# Patient Record
Sex: Male | Born: 1955 | Race: White | Hispanic: No | Marital: Married | Smoking: Former smoker
Health system: Southern US, Community
[De-identification: ages and names within clinical notes are randomized; demographics above are authoritative.]

## PROBLEM LIST (undated history)

## (undated) DIAGNOSIS — C4490 Unspecified malignant neoplasm of skin, unspecified: Secondary | ICD-10-CM

## (undated) DIAGNOSIS — K429 Umbilical hernia without obstruction or gangrene: Secondary | ICD-10-CM

## (undated) DIAGNOSIS — K5720 Diverticulitis of large intestine with perforation and abscess without bleeding: Secondary | ICD-10-CM

## (undated) DIAGNOSIS — K635 Polyp of colon: Secondary | ICD-10-CM

## (undated) DIAGNOSIS — G4730 Sleep apnea, unspecified: Secondary | ICD-10-CM

## (undated) DIAGNOSIS — M1990 Unspecified osteoarthritis, unspecified site: Secondary | ICD-10-CM

## (undated) DIAGNOSIS — H269 Unspecified cataract: Secondary | ICD-10-CM

## (undated) DIAGNOSIS — K5792 Diverticulitis of intestine, part unspecified, without perforation or abscess without bleeding: Secondary | ICD-10-CM

## (undated) HISTORY — DX: Sleep apnea, unspecified: G47.30

## (undated) HISTORY — DX: Diverticulitis of intestine, part unspecified, without perforation or abscess without bleeding: K57.92

## (undated) HISTORY — DX: Polyp of colon: K63.5

## (undated) HISTORY — DX: Diverticulitis of large intestine with perforation and abscess without bleeding: K57.20

## (undated) HISTORY — DX: Umbilical hernia without obstruction or gangrene: K42.9

## (undated) HISTORY — DX: Unspecified cataract: H26.9

## (undated) HISTORY — DX: Unspecified osteoarthritis, unspecified site: M19.90

## (undated) HISTORY — DX: Unspecified malignant neoplasm of skin, unspecified: C44.90

## (undated) HISTORY — PX: COLONOSCOPY W/ POLYPECTOMY: SHX1380

---
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
## 1989-04-06 HISTORY — PX: OTHER SURGICAL HISTORY: SHX169

## 1994-04-06 HISTORY — PX: LAPAROSCOPIC CHOLECYSTECTOMY: SUR755

## 2009-11-25 ENCOUNTER — Ambulatory Visit: Payer: Self-pay | Admitting: Family Medicine

## 2009-11-25 DIAGNOSIS — R05 Cough: Secondary | ICD-10-CM

## 2009-11-25 DIAGNOSIS — K8020 Calculus of gallbladder without cholecystitis without obstruction: Secondary | ICD-10-CM | POA: Insufficient documentation

## 2009-11-25 DIAGNOSIS — Z8719 Personal history of other diseases of the digestive system: Secondary | ICD-10-CM

## 2009-11-25 DIAGNOSIS — Z85828 Personal history of other malignant neoplasm of skin: Secondary | ICD-10-CM

## 2009-11-25 LAB — CONVERTED CEMR LAB
Blood in Urine, dipstick: NEGATIVE
Nitrite: NEGATIVE
Protein, U semiquant: NEGATIVE
Urobilinogen, UA: 0.2
WBC Urine, dipstick: NEGATIVE

## 2009-11-26 LAB — CONVERTED CEMR LAB
ALT: 24 units/L (ref 0–53)
BUN: 18 mg/dL (ref 6–23)
CO2: 28 meq/L (ref 19–32)
Chloride: 104 meq/L (ref 96–112)
Creatinine, Ser: 1 mg/dL (ref 0.4–1.5)
Eosinophils Absolute: 0.2 10*3/uL (ref 0.0–0.7)
Eosinophils Relative: 2.7 % (ref 0.0–5.0)
Glucose, Bld: 82 mg/dL (ref 70–99)
HCT: 42.5 % (ref 39.0–52.0)
Lymphs Abs: 1.6 10*3/uL (ref 0.7–4.0)
MCHC: 34.3 g/dL (ref 30.0–36.0)
MCV: 93.2 fL (ref 78.0–100.0)
Monocytes Absolute: 0.8 10*3/uL (ref 0.1–1.0)
PSA: 0.52 ng/mL (ref 0.10–4.00)
Platelets: 185 10*3/uL (ref 150.0–400.0)
Potassium: 4.7 meq/L (ref 3.5–5.1)
RDW: 13.3 % (ref 11.5–14.6)
TSH: 1.26 microintl units/mL (ref 0.35–5.50)
Total Bilirubin: 0.6 mg/dL (ref 0.3–1.2)
WBC: 7.5 10*3/uL (ref 4.5–10.5)

## 2009-12-13 ENCOUNTER — Telehealth: Payer: Self-pay | Admitting: Family Medicine

## 2009-12-13 ENCOUNTER — Ambulatory Visit: Payer: Self-pay | Admitting: Family Medicine

## 2009-12-17 ENCOUNTER — Ambulatory Visit: Payer: Self-pay | Admitting: Cardiovascular Disease

## 2010-01-08 ENCOUNTER — Telehealth: Payer: Self-pay | Admitting: Family Medicine

## 2010-01-13 ENCOUNTER — Telehealth: Payer: Self-pay | Admitting: Family Medicine

## 2010-05-06 NOTE — Assessment & Plan Note (Signed)
Summary: NEW TO EST--REQUESTING CPX--WILL FAST//CCM   Vital Signs:  Patient profile:   55 year old male Height:      72.5 inches Weight:      207 pounds BMI:     27.79 Temp:     98.1 degrees F oral BP sitting:   118 / 80  (left arm) Cuff size:   regular  Vitals Entered By: Fernando Mcfarland CMA Fernando Mcfarland) (November 25, 2009 8:36 AM) CC: new to establish, sinus headache Is Patient Diabetic? No Pain Assessment Patient in pain? no        CC:  new to establish and sinus headache.  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  He's always been health he said no chronic health problems except he had a tonsillectomy as a child.  Gallbladder removed in 1990 had 18 inches of his colon removed because of a diverticular abscess.  Prior to that.  He also had a history of colon polyps.  He son every 5-year recall.  Last colonoscopy was 4 years ago.  It was normal.  He's had two basal cells removed.  He grew up in [place] spell time outdoors fishing and hunting.  He's also had a, months, history of a cough.  It started when he moved here to [place].  He purchased a house.  It was two years old.  However the, houses never been, lived in.  There was some mold underneath the house.  Tetanus booster unknown, who get his old records.  Preventive Screening-Counseling & Management  Alcohol-Tobacco     Smoking Status: quit  Hep-HIV-STD-Contraception     Dental Visit-last 6 months yes      Drug Use:  no.    Allergies (verified): No Known Drug Allergies  Past History:  Past medical, surgical, family and social histories (including risk factors) reviewed, and no changes noted (except as noted below).  Past Medical History: Cholelithiasis Diverticulitis, hx of Skin  cancer, hx of..........BCE x 2  Past Surgical History: Cholecystectomy Tonsillectomy colon polyp   Family History: Reviewed history and no changes required. Father: alzhieimers Mother: hearth disease Siblings: sister - healthy  Social History: Reviewed history and no changes required. Occupation: Ecologist Married Alcohol use-yes Drug use-no Smoking Status:  quit Drug Use:  no Dental Care w/in 6 mos.:  yes  Review of Systems      See HPI  Physical Exam  General:  Well-developed,well-nourished,in no acute distress; alert,appropriate and cooperative throughout examination Head:  Normocephalic and atraumatic without obvious abnormalities. No apparent alopecia or balding. Eyes:  No corneal or conjunctival inflammation noted. EOMI. Perrla. Funduscopic exam benign, without hemorrhages, exudates or papilledema. Vision grossly normal. Ears:  External ear exam shows no significant lesions or deformities.  Otoscopic examination reveals clear canals, tympanic membranes are intact bilaterally without bulging, retraction, inflammation or discharge. Hearing is grossly normal bilaterally. Nose:  External nasal examination shows no deformity or inflammation. Nasal mucosa are pink and moist without lesions or exudates. Mouth:  Oral mucosa and oropharynx without lesions or exudates.  Teeth in good repair. Neck:  No deformities, masses, or tenderness noted. Chest Wall:  No deformities, masses, tenderness or gynecomastia noted. Breasts:  No masses or gynecomastia noted Lungs:  Normal respiratory effort, chest expands symmetrically. Lungs are clear  to auscultation, no crackles or wheezes. Heart:  Normal rate and regular rhythm. S1 and S2 normal without gallop, murmur, click, rub or other extra sounds. Abdomen:  Bowel sounds positive,abdomen soft and non-tender without masses, organomegaly or hernias noted...........scar from midline to the pubis from previous exploratory surgery Rectal:  No  external abnormalities noted. Normal sphincter tone. No rectal masses or tenderness. Genitalia:  Testes bilaterally descended without nodularity, tenderness or masses. No scrotal masses or lesions. No penis lesions or urethral discharge. Prostate:  Prostate gland firm and smooth, no enlargement, nodularity, tenderness, mass, asymmetry or induration. Msk:  No deformity or scoliosis noted of thoracic or lumbar spine.   Pulses:  R and L carotid,radial,femoral,dorsalis pedis and posterior tibial pulses are full and equal bilaterally Extremities:  No clubbing, cyanosis, edema, or deformity noted with normal full range of motion of all joints.   Neurologic:  No cranial nerve deficits noted. Station and gait are normal. Plantar reflexes are down-going bilaterally. DTRs are symmetrical throughout. Sensory, motor and coordinative functions appear intact. Skin:  Intact without suspicious lesions or rashes Cervical Nodes:  No lymphadenopathy noted Axillary Nodes:  No palpable lymphadenopathy Inguinal Nodes:  No significant adenopathy Psych:  Cognition and judgment appear intact. Alert and cooperative with normal attention span and concentration. No apparent delusions, illusions, hallucinations   Impression & Recommendations:  Problem # 1:  Preventive Health Care (ICD-V70.0) Assessment Comment Only  Problem # 2:  COUGH (ICD-786.2) Assessment: New  Orders: Venipuncture (27253) TLB-Lipid Panel (80061-LIPID) TLB-BMP (Basic Metabolic Panel-BMET) (80048-METABOL) TLB-CBC Platelet - w/Differential (85025-CBCD) TLB-Hepatic/Liver Function Pnl (80076-HEPATIC) TLB-TSH (Thyroid Stimulating Hormone) (84443-TSH) TLB-PSA (Prostate Specific Antigen) (84153-PSA) UA Dipstick w/o Micro (automated)  (81003)  Complete Medication List: 1)  Naproxen 375 Mg Tabs (Naproxen) .... Take one tab by mouth two times a day 2)  Prednisone 20 Mg Tabs (Prednisone) .... Uad  Other Orders: Specimen Handling (66440) EKG w/  Interpretation (93000) Tdap => 14yrs IM (34742) Admin 1st Vaccine (59563)  Patient Instructions: 1)  you could take 10 mg of Zyrtec nightly at bedtime to see if that would relieve the postnasal drip and cough.  If after a week to 10 days.  This is not work then I would recommend a short course of prednisone.  Two tabs x 3 days, one x 3 days, a half x 3 days, then a half a tablet Monday, Wednesday, Friday, for a two week taper.  Return p.r.n. 2)  Please schedule a follow-up appointment in 1 year. 3)  Take an Aspirin every day. 4)  please call us when you get the date of your last tetanus booster. 5)  I would recommend Dr. Gweneth Dimitri, ophthalmologist. 6)  If your wife needs anything have her  call Rachael directly Prescriptions: PREDNISONE 20 MG TABS (PREDNISONE) UAD  #30 x 1   Entered and Authorized by:   Roderick Pee MD   Signed by:   Roderick Pee MD on 11/25/2009   Method used:   Print then Give to Patient   RxID:   [id number]    Immunizations Administered:  Tetanus Vaccine:    Vaccine Type: Tdap    Site: right deltoid    Mfr: GlaxoSmithKline    Dose: 0.5 ml    Route: IM    Given by: Fernando Mcfarland CMA (AAMA)    Exp. Date: 01/24/2012    Lot #: [id number]    VIS given: 02/22/07 version given November 25, 2009.    Physician counseled: yes  Laboratory  Results   Urine Tests    Routine Urinalysis   Color: yellow Appearance: Clear Glucose: negative   (Normal Range: Negative) Bilirubin: negative   (Normal Range: Negative) Ketone: negative   (Normal Range: Negative) Spec. Gravity: 1.010   (Normal Range: 1.003-1.035) Blood: negative   (Normal Range: Negative) pH: 5.0   (Normal Range: 5.0-8.0) Protein: negative   (Normal Range: Negative) Urobilinogen: 0.2   (Normal Range: 0-1) Nitrite: negative   (Normal Range: Negative) Leukocyte Esterace: negative   (Normal Range: Negative)    Comments: Rita Ohara  November 25, 2009 10:30 AM

## 2010-05-06 NOTE — Progress Notes (Signed)
Summary: wants antibiotic  Phone Note Call from Patient Call back at [phone]   Summary of Call: Still coughing.  Wants antibiotic.  No fever.  Cough non productive.  Sinus drainage & drip.  On the prednisolone taper.  Cough still there.  [facility]  [facility]. Initial call taken by: Rudy Jew, RN,  December 13, 2009 8:50 AM  Follow-up for Phone Call        OV today for evaluation Follow-up by: Roderick Pee MD,  December 13, 2009 8:55 AM  Additional Follow-up for Phone Call Additional follow up Details #1::        Phone Call Completed Additional Follow-up by: Kern Reap CMA Duncan Dull),  December 13, 2009 9:43 AM

## 2010-05-06 NOTE — Assessment & Plan Note (Signed)
Summary: cough - rv   Vital Signs:  Patient profile:   55 year old male Weight:      210 pounds Temp:     97.9 degrees F oral BP sitting:   120 / 72  (right arm) Cuff size:   regular  Vitals Entered By: Kathrynn Speed CMA (December 13, 2009 3:33 PM) CC: cough x 7 weeks, src Is Patient Diabetic? No   CC:  cough x 7 weeks and src.  History of Present Illness: Fernando Mcfarland is a 55 year old, married male, nonsmoker, who comes in today for evaluation of a cough x 7 weeks.  His dentist in his first note of August, the 22nd.  He moved here from [place], [place].  The house had some mold, and he and his wife began coughing.  Her cough went away.  His cough has persisted.  We saw him on the 22nd and at that time.  He was wheezing.  We put him on a short course of prednisone, but it didn't help much.  He feels like he is only 25% improved.  He has no fever or sputum production et Karie Soda.  He does have a lot of postnasal drip and is concerned he may have sinusitis.  Preventive Screening-Counseling & Management  Alcohol-Tobacco     Smoking Status: quit  Current Medications (verified): 1)  Naproxen 375 Mg Tabs (Naproxen) .... Take One Tab By Mouth Two Times A Day 2)  Prednisone 20 Mg Tabs (Prednisone) .... Uad  Allergies (verified): No Known Drug Allergies  Physical Exam  General:  Well-developed,well-nourished,in no acute distress; alert,appropriate and cooperative throughout examination Head:  Normocephalic and atraumatic without obvious abnormalities. No apparent alopecia or balding. Eyes:  No corneal or conjunctival inflammation noted. EOMI. Perrla. Funduscopic exam benign, without hemorrhages, exudates or papilledema. Vision grossly normal. Ears:  External ear exam shows no significant lesions or deformities.  Otoscopic examination reveals clear canals, tympanic membranes are intact bilaterally without bulging, retraction, inflammation or discharge. Hearing is grossly normal  bilaterally. Nose:  External nasal examination shows no deformity or inflammation. Nasal mucosa are pink and moist without lesions or exudates. Mouth:  Oral mucosa and oropharynx without lesions or exudates.  Teeth in good repair. Neck:  No deformities, masses, or tenderness noted. Chest Wall:  No deformities, masses, tenderness or gynecomastia noted. Lungs:  Normal respiratory effort, chest expands symmetrically. Lungs are clear to auscultation, no crackles or wheezes.   Impression & Recommendations:  Problem # 1:  COUGH (ICD-786.2) Assessment Unchanged  Orders: T-2 View CXR (71020TC) T-Sinuses Complete (70220TC)  Complete Medication List: 1)  Naproxen 375 Mg Tabs (Naproxen) .... Take one tab by mouth two times a day 2)  Prednisone 20 Mg Tabs (Prednisone) .... Uad  Other Orders: Radiology Referral (Radiology)  Patient Instructions: 1)  go to the main office now for x-rays of your sinuses.  I will call you  when I  get the report 2)  the sinus films and x-ray reports were normal except for an abnormality on the sinus x-rays.  The radiologist is requesting a CT scan. 3)  Most likely symptoms consistent with reactive airway disease.  Prednisone 60 mg for 3 days, 40 for 3 days, 20 for 3 days, then 10 mg Monday, Wednesday, Friday, for two week taper it in 9 days.  He does not feel any improvement.  He is to call and we will get him set up for a pulmonary consult. 4)  Also will get him set up for  a CT scan per radiologist.  Recommendations

## 2010-05-06 NOTE — Progress Notes (Signed)
Summary: antibiotic  Phone Note Call from Patient   Caller: Patient Call For: Roderick Pee MD Summary of Call: Pt is asking for an antibiotic before he makes a referral to a specialist??  [phone] Initial call taken by: [facility] CMA,  January 13, 2010 4:53 PM  Follow-up for Phone Call        expl. no evid.of bact.inf.......cough x 2 mo. ,,,,,,,,,the patient does not want to go see a specialist for further diagnostic studies to evaluate the cause of the cough.  He wants to take an OTC Claritin because now he says he feeling somewhat better. Follow-up by: Roderick Pee MD,  January 13, 2010 5:21 PM

## 2010-05-06 NOTE — Progress Notes (Signed)
Summary: cough continues  Phone Note Call from Patient Call back at Work Phone [phone]   Summary of Call: Still coughing after 2 rounds of prednisone.  What to do next?  [facility]  [facility].  Initial call taken by: Rudy Jew, RN,  January 08, 2010 1:15 PM  Follow-up for Phone Call        we will arrange for a pulmonary consult ASAP Follow-up by: Roderick Pee MD,  January 09, 2010 8:42 AM  Additional Follow-up for Phone Call Additional follow up Details #1::        Appt Scheduled.  LMOM for pt. Additional Follow-up by: Corky Mull,  January 10, 2010 8:02 AM

## 2010-05-29 ENCOUNTER — Telehealth: Payer: Self-pay | Admitting: *Deleted

## 2010-05-29 DIAGNOSIS — R05 Cough: Secondary | ICD-10-CM

## 2010-05-29 NOTE — Telephone Encounter (Signed)
patient  Is requesting a referral for pulmonology

## 2010-06-12 ENCOUNTER — Institutional Professional Consult (permissible substitution): Payer: Self-pay | Admitting: Emergency Medicine

## 2010-06-23 ENCOUNTER — Encounter: Payer: Self-pay | Admitting: Emergency Medicine

## 2010-06-23 ENCOUNTER — Institutional Professional Consult (permissible substitution) (INDEPENDENT_AMBULATORY_CARE_PROVIDER_SITE_OTHER): Payer: 59 | Admitting: Emergency Medicine

## 2010-06-23 DIAGNOSIS — R05 Cough: Secondary | ICD-10-CM

## 2010-06-23 LAB — CONVERTED CEMR LAB
Cholesterol, target level: 200 mg/dL
HDL goal, serum: 40 mg/dL

## 2010-07-03 NOTE — Assessment & Plan Note (Signed)
Summary: cough   Visit Type:  Initial Consult Copy to:  Dr. Kelle Darting  CC:  Pulmonary consult for chronic dry cough...PND...tickle in his throat and Lipid Management.  History of Present Illness: 55 yo former smoker, OSA. Referred for cough. No hx childhood asthma.   He caught a URI beginning Spring '11, has had dry cough ever since, sometimes prod of clear. Has been rx 2x with pred, may have helped some but not much. Was on claritin for a couple weeks without relief. Remains active, no breathing limitations. Has had CT scans in [place] as part of lung CA screening trial.   Lipid Management History:      Positive NCEP/ATP III risk factors include male age 57 years old or older.  Negative NCEP/ATP III risk factors include non-tobacco-user status.     Preventive Screening-Counseling & Management  Alcohol-Tobacco     Alcohol drinks/day: 3     Alcohol type: beer     Smoking Status: quit     Packs/Day: 2.0     Year Started: 1977     Year Quit: 1992     Pack years: 30  Current Medications (verified): 1)  Naproxen 375 Mg Tabs (Naproxen) .... Take One Tab By Mouth Two Times A Day 2)  Claritin 10 Mg Tabs (Loratadine) .Marland Kitchen.. 1 By Mouth Daily As Needed  Allergies (verified): No Known Drug Allergies  Past History:  Past Medical History: Last updated: 11/25/2009 Cholelithiasis Diverticulitis, hx of Skin cancer, hx of..........BCE x 2  Family History: Last updated: 06/23/2010 Father: alzhieimers Mother: hearth disease Siblings: sister - healthy Family History Emphysema  --- PGF Family History Breast Cancer --- mother [place] --- mother  Past Surgical History: Reviewed history from 11/25/2009 and no changes required. Cholecystectomy Tonsillectomy colon polyp   Family History: Father: alzhieimers Mother: hearth disease Siblings: sister - healthy Family History Emphysema  --- PGF Family History Breast Cancer --- mother Bundle Branch --- mother  Social  History: Reviewed history from 11/25/2009 and no changes required. Occupation: Ecologist Married Alcohol use-yes Drug use-no Patient states former smoker.  Alcohol drinks/day:  3 Packs/Day:  2.0 Pack years:  30  Vital Signs:  Patient profile:   55 year old male Height:      71 inches (180.34 cm) Weight:      208.25 pounds (94.66 kg) BMI:     29.15 O2 Sat:      95 % on Room air Temp:     98.0 degrees F (36.67 degrees C) oral Pulse rate:   90 / minute BP sitting:   138 / 82  (left arm) Cuff size:   regular  Vitals Entered By: Michel Bickers CMA (June 23, 2010 3:50 PM)  O2 Sat at Rest %:  95 O2 Flow:  Room air CC: Pulmonary consult for chronic dry cough...PND...tickle in his throat, Lipid Management Is Patient Diabetic? No Comments Medications reviewed with patient Michel Bickers CMA  June 23, 2010 3:51 PM   Physical Exam  General:  well developed, well nourished, in no acute distress Head:  normocephalic and atraumatic Eyes:  conjunctiva and sclera clear Nose:  no deformity, discharge, inflammation, or lesions Mouth:  no deformity or lesions Neck:  no masses, thyromegaly, or abnormal cervical nodes Chest Wall:  no deformities noted Lungs:  clear bilaterally to auscultation and percussion Heart:  regular rate and rhythm, S1, S2 without murmurs, rubs, gallops, or clicks Msk:  no deformity or scoliosis noted with normal posture Extremities:  no clubbing,  cyanosis, edema, or deformity noted Skin:  intact without lesions or rashes Psych:  alert and cooperative; normal mood and affect; normal attention span and concentration   Medications Added to Medication List This Visit: 1)  Claritin 10 Mg Tabs (Loratadine) .Marland Kitchen.. 1 by mouth daily as needed  Other Orders: Consultation Level IV (16109)  Lipid Assessment/Plan:      Based on NCEP/ATP III, the patient's risk factor category is "0-1 risk factors".  The patient's lipid goals are as follows: Total cholesterol goal is 200;  LDL cholesterol goal is 160; HDL cholesterol goal is 40; Triglyceride goal is 150.    Patient Instructions: 1)  Start Claritin (loratadine) 10mg  once daily  2)  Start nasal saline washes every day 3)  IF you are still coughing after 3 weeks, then start omeprazole 20mg  (Prilosec) once daily  4)  Try to avoid throat clearing and mentholated cough drops. Use a sugar-free candy instead 5)  Follow with Dr Delton Coombes in 1 month

## 2010-07-09 ENCOUNTER — Telehealth: Payer: Self-pay | Admitting: Emergency Medicine

## 2010-07-09 NOTE — Telephone Encounter (Signed)
Pt was calling to verify from last OV was he to start taking the omeprazole if he was still having cough. ia dvised pt per instructions it states he was to start taking the omeprazole. Pt verbalized understanding and needed nothing else further

## 2010-07-28 ENCOUNTER — Encounter: Payer: Self-pay | Admitting: Emergency Medicine

## 2010-07-30 ENCOUNTER — Ambulatory Visit (INDEPENDENT_AMBULATORY_CARE_PROVIDER_SITE_OTHER): Payer: 59 | Admitting: Emergency Medicine

## 2010-07-30 VITALS — BP 124/84 | HR 74 | Temp 97.8°F | Ht 72.0 in | Wt 211.8 lb

## 2010-07-30 DIAGNOSIS — R05 Cough: Secondary | ICD-10-CM

## 2010-07-30 NOTE — Patient Instructions (Signed)
We will perform full pulmonary function testing at the time of your next appointment Follow up with Dr Delton Coombes next available appointment.  We will arrange bronchoscopy as an outpatient.

## 2010-07-30 NOTE — Progress Notes (Signed)
  Subjective:    Patient ID: Fernando Mcfarland, male    DOB: 11/29/1955, 55 y.o.   MRN: [id number]  HPI 55 yo former smoker, OSA. Referred for cough. No hx childhood asthma.   He caught a URI beginning Spring '11, has had dry cough ever since, sometimes prod of clear. Has been rx 2x with pred, may have helped some but not much. Was on claritin for a couple weeks without relief. Remains active, no breathing limitations. Has had CT scans in [place] as part of lung CA screening trial, stable nodules x 3 yrs.   ROV 07/30/10 -- f/u for cough. Last time we started loratadine and [place], then omeprazole - overlapped these for 10 - 14 days. Cough is a bit less but still present. Feels globus sensation. Able to exercise without cough, denies any allergic or GERD sx.    Review of Systems As per HPI    Objective:   Physical Exam Gen: Pleasant, well-nourished, in no distress,  normal affect  ENT: No lesions,  mouth clear,  oropharynx clear, no postnasal drip  Neck: No JVD, no TMG, no carotid bruits  Lungs: No use of accessory muscles, no dullness to percussion, clear without rales or rhonchi  Cardiovascular: RRR, heart sounds normal, no murmur or gallops, no peripheral edema  Musculoskeletal: No deformities, no cyanosis or clubbing  Neuro: alert, non focal  Skin: Warm, no lesions or rashes        Assessment & Plan:  COUGH Have empirically rx for allergic rhinitis and GERD, continues to have sx.  In a smoker we need to consider AFL, possible UA or endobronchial lesion.  - will arrange for PFT and FOB to perform airway exam - f/u next available

## 2010-07-30 NOTE — Assessment & Plan Note (Signed)
Have empirically rx for allergic rhinitis and GERD, continues to have sx.  In a smoker we need to consider AFL, possible UA or endobronchial lesion.  - will arrange for PFT and FOB to perform airway exam - f/u next available

## 2010-07-31 ENCOUNTER — Telehealth: Payer: Self-pay | Admitting: *Deleted

## 2010-07-31 DIAGNOSIS — G4730 Sleep apnea, unspecified: Secondary | ICD-10-CM

## 2010-07-31 NOTE — Telephone Encounter (Signed)
Bronchoscopy scheduled for Mon., 08/04/2010 @ [facility]. Pt needs to arrive @ 11:15 am in admitting, procedure will be done at 12:30 pm. NPO after midnight and someone needs to be him to drive. LMOMTCB.

## 2010-07-31 NOTE — Telephone Encounter (Addendum)
Pt is calling abck because he forgot to mention that he is on CPAP through [employer] out of [place], [place] and his machine is approx 56 years old. He says he probably doesn't clean the machine as often as he should and may need a new one. Pt would like to know if part of the problem could be coming from his cpap. Pls advise.

## 2010-07-31 NOTE — Telephone Encounter (Signed)
Pt will be out of town on 4/30 and cannot do bronch that day. After looking at [facility] schedule and coordinating with the patients schedule, bronch is rescheduled for Monday, May 7th @ 8 am. RB is listed as covering [facility] that morning. Will confirm with RB that he can do this date and then call the pt on cell, [phone], to confirm with him.

## 2010-08-04 ENCOUNTER — Encounter (HOSPITAL_COMMUNITY): Payer: 59

## 2010-08-04 NOTE — Telephone Encounter (Signed)
Bronch is sch for May 7th @ 8:30 am. Will let pt know that he does not need to arrive until 7:15 am.  Pls advise re: cpap

## 2010-08-04 NOTE — Telephone Encounter (Signed)
Scheduled FOB for May 2. RSB

## 2010-08-05 DIAGNOSIS — G4730 Sleep apnea, unspecified: Secondary | ICD-10-CM | POA: Insufficient documentation

## 2010-08-05 NOTE — Telephone Encounter (Signed)
I would like to have him set up with [facility] locally, get him new equipment. Will order this now.

## 2010-08-05 NOTE — Telephone Encounter (Signed)
Addended by: Levy Pupa on: 08/05/2010 09:48 PM   Modules accepted: Orders

## 2010-08-07 ENCOUNTER — Telehealth: Payer: Self-pay | Admitting: Emergency Medicine

## 2010-08-07 NOTE — Telephone Encounter (Signed)
Called and spoke with pt.  He is aware that bronch is sched for May 7th at 8:30 am and needs to be there at 7:15 am at [facility] admitting.  Advised NPO after midnight the night before and needs to have someone drive him there and plan to be there for at least 2 to 3 hours.  Pt verbalized understanding and denied any further questions.

## 2010-08-11 ENCOUNTER — Ambulatory Visit (HOSPITAL_COMMUNITY)
Admission: RE | Admit: 2010-08-11 | Discharge: 2010-08-11 | Disposition: A | Discharge status: Home / Self Care | Payer: 59 | Source: Ambulatory Visit | Attending: Emergency Medicine | Admitting: Emergency Medicine

## 2010-08-11 ENCOUNTER — Encounter (HOSPITAL_COMMUNITY): Payer: 59

## 2010-08-11 ENCOUNTER — Other Ambulatory Visit: Payer: Self-pay | Admitting: Emergency Medicine

## 2010-08-11 DIAGNOSIS — R05 Cough: Secondary | ICD-10-CM | POA: Insufficient documentation

## 2010-08-11 DIAGNOSIS — R059 Cough, unspecified: Secondary | ICD-10-CM | POA: Insufficient documentation

## 2010-08-13 LAB — CULTURE, RESPIRATORY W GRAM STAIN: Gram Stain: NONE SEEN

## 2010-08-20 ENCOUNTER — Telehealth: Payer: Self-pay | Admitting: Emergency Medicine

## 2010-08-20 NOTE — Telephone Encounter (Signed)
lmomtcb x1 

## 2010-08-21 NOTE — Telephone Encounter (Signed)
I told pt it was ok to defer PFT post-FOB. We will revisit the issue at [facility] depending on his cough. He may need to be seen by ENT. Will discuss at [facility]

## 2010-08-21 NOTE — Telephone Encounter (Signed)
Pt is aware that his pft has been cancelled by susan this morning.  Will forward message to RB to make aware that pt cancelled the PFT due to bronch done.

## 2010-08-21 NOTE — Telephone Encounter (Signed)
Patient phoned stated that he was returning a call to triage he can be reached at [phone]. Stated that is trying to do [place] a favor he just needs to make sure that his PFT  Was canceled because he had a Bronch. I canceled the PFT and apologized to the patient.Vedia Coffer

## 2010-08-21 NOTE — Telephone Encounter (Signed)
LM w/ wife for pt to call back

## 2010-08-22 NOTE — Op Note (Signed)
  Fernando Mcfarland, Fernando Mcfarland            ACCOUNT NO.:  [id number]  MEDICAL RECORD NO.:  [id number]           PATIENT TYPE:  O  LOCATION:  RESP                         FACILITY:  [facility]  PHYSICIAN:  Leslye Peer, MD    DATE OF BIRTH:  1955-06-07  DATE OF PROCEDURE:  08/11/2010 DATE OF DISCHARGE:  08/11/2010                              OPERATIVE REPORT   INDICATION:  Chronic cough.  MEDICATIONS GIVEN:  Fentanyl 100 mcg IV in divided doses, Versed 6 mg IV in divided doses, lidocaine 1% for approximately 24 mL to the tracheobronchial tree.  CONSENT:  Informed consent was obtained from the patient and a signed copy was on his hospital chart.  PROCEDURE DETAILS:  After informed consent was obtained, conscious sedation was initiated as outlined above.  A formal time-out was performed and then the fiberoptic bronchoscope was introduced through the right naris without difficulty.  We were able to visualize some cobblestoning of the posterior pharynx consistent with chronicirritation, probably from gastroesophageal reflux and/or postnasal drip. The glottis itself showed some very mild hypertrophy above and possibly including the right vocal cord.  There was some asymmetric movement with both respiration and phonation.  There was no obvious mass or polyp. There was no ulceration or evidence for an infectious process or abscess.  The trachea was intubated and was normal in appearance.  Local anesthesia was achieved with 1% lidocaine and general airway inspection was performed.  All the other airways were normal and photos were taken to include on the patient's chart.  This included the bilateral mainstem bronchi, the right upper lobe, middle lobe and lower lobe airways as well as the left upper lobe lingular and left lower lobe airways.  There were no endobronchial lesions or abnormal secretions seen.  A bronchoalveolar lavage was performed from the right upper lobe to be sent for culture and  cytology.  The patient tolerated the procedure well.  There was no blood loss and he was sent to the recovery room in good condition.  SAMPLES:  Bronchoalveolar lavage from the right upper lobe to be sent for microbiology and cytology.  PLAN:  I will increase Mr. Mazo to omeprazole 20 mg b.i.d. empirically to treat possible component of GERD.  We will consider referral to ears, nose and throat physician to look at his glottis and his right vocal cord to ensure that there is no pathology present.  I will defer his PFTs at this time.  He will follow up with me in the office as an outpatient.     Leslye Peer, MD     RSB/MEDQ  D:  08/13/2010  T:  08/13/2010  Job:  [id number]  Electronically Signed by Levy Pupa MD on 08/22/2010 11:36:28 AM

## 2010-08-25 ENCOUNTER — Encounter: Payer: Self-pay | Admitting: Emergency Medicine

## 2010-08-26 ENCOUNTER — Encounter: Payer: Self-pay | Admitting: Emergency Medicine

## 2010-08-29 ENCOUNTER — Encounter: Payer: Self-pay | Admitting: Emergency Medicine

## 2010-08-29 ENCOUNTER — Ambulatory Visit (INDEPENDENT_AMBULATORY_CARE_PROVIDER_SITE_OTHER): Payer: 59 | Admitting: Emergency Medicine

## 2010-08-29 DIAGNOSIS — G4730 Sleep apnea, unspecified: Secondary | ICD-10-CM

## 2010-08-29 DIAGNOSIS — R05 Cough: Secondary | ICD-10-CM

## 2010-08-29 NOTE — Patient Instructions (Signed)
We will try stopping your loratadine.  If you tolerate this change (no more cough) for 2 weeks than try decreasing the omeprazole to once a day  If you tolerate omeprazole once daily for 2 weeks, then try stopping altogether.  We will not send you for ENT evaluation at this time.  We will defer lung function testing for now, but you would need this if your breathing changes.  Follow up with Dr Byrum as needed. 

## 2010-08-29 NOTE — Progress Notes (Signed)
  Subjective:    Patient ID: Fernando Mcfarland, male    DOB: 03/04/1956, 55 y.o.   MRN: [id number]  HPI 55 yo former smoker, OSA. Referred for cough. No hx childhood asthma.   He caught a URI beginning Spring '11, has had dry cough ever since, sometimes prod of clear. Has been rx 2x with pred, may have helped some but not much. Was on claritin for a couple weeks without relief. Remains active, no breathing limitations. Has had CT scans in [place] as part of lung CA screening trial, stable nodules x 3 yrs.   ROV 07/30/10 -- f/u for cough. Last time we started loratadine and NSW, then omeprazole - overlapped these for 10 - 14 days. Cough is a bit less but still present. Feels globus sensation. Able to exercise without cough, denies any allergic or GERD sx.   ROV 08/29/10 -- follows up for cough after FOB showed posterior pharyngeal cobblestoning as well as possible R cord hypertrophy. We increased the omeprazole to bid and the cough is much better.   Review of Systems As per HPI    Objective:   Physical Exam Gen: Pleasant, well-nourished, in no distress,  normal affect  ENT: No lesions,  mouth clear,  oropharynx clear, no postnasal drip  Neck: No JVD, no TMG, no carotid bruits  Lungs: No use of accessory muscles, no dullness to percussion, clear without rales or rhonchi  Cardiovascular: RRR, heart sounds normal, no murmur or gallops, no peripheral edema  Musculoskeletal: No deformities, no cyanosis or clubbing  Neuro: alert, non focal  Skin: Warm, no lesions or rashes     Assessment & Plan:

## 2010-08-29 NOTE — Assessment & Plan Note (Signed)
Will obtain new mask via Christoper Allegra

## 2010-08-29 NOTE — Assessment & Plan Note (Signed)
We will try stopping your loratadine.  If you tolerate this change (no more cough) for 2 weeks than try decreasing the omeprazole to once a day  If you tolerate omeprazole once daily for 2 weeks, then try stopping altogether.  We will not send you for ENT evaluation at this time.  We will defer lung function testing for now, but you would need this if your breathing changes.  Follow up with Dr Delton Coombes as needed.

## 2010-09-10 LAB — FUNGUS CULTURE W SMEAR: Fungal Smear: NONE SEEN

## 2010-09-23 LAB — AFB CULTURE WITH SMEAR (NOT AT ARMC)

## 2011-01-14 ENCOUNTER — Ambulatory Visit (INDEPENDENT_AMBULATORY_CARE_PROVIDER_SITE_OTHER): Payer: 59

## 2011-01-14 DIAGNOSIS — Z23 Encounter for immunization: Secondary | ICD-10-CM

## 2011-02-23 ENCOUNTER — Other Ambulatory Visit (INDEPENDENT_AMBULATORY_CARE_PROVIDER_SITE_OTHER): Payer: 59

## 2011-02-23 DIAGNOSIS — Z0000 Encounter for general adult medical examination without abnormal findings: Secondary | ICD-10-CM

## 2011-02-23 LAB — LDL CHOLESTEROL, DIRECT: Direct LDL: 133.3 mg/dL

## 2011-02-23 LAB — POCT URINALYSIS DIPSTICK
Bilirubin, UA: NEGATIVE
Ketones, UA: NEGATIVE
Leukocytes, UA: NEGATIVE
Protein, UA: NEGATIVE
Spec Grav, UA: <=1.005

## 2011-02-23 LAB — BASIC METABOLIC PANEL
CO2: 26 mEq/L (ref 19–32)
Chloride: 105 mEq/L (ref 96–112)
Potassium: 5.2 mEq/L — ABNORMAL HIGH (ref 3.5–5.1)
Sodium: 139 mEq/L (ref 135–145)

## 2011-02-23 LAB — HEPATIC FUNCTION PANEL
ALT: 28 U/L (ref 0–53)
Albumin: 4.3 g/dL (ref 3.5–5.2)
Alkaline Phosphatase: 42 U/L (ref 39–117)
Bilirubin, Direct: 0.1 mg/dL (ref 0.0–0.3)
Total Protein: 7 g/dL (ref 6.0–8.3)

## 2011-02-23 LAB — CBC WITH DIFFERENTIAL/PLATELET
Basophils Relative: 0.4 % (ref 0.0–3.0)
Eosinophils Absolute: 0.1 10*3/uL (ref 0.0–0.7)
Eosinophils Relative: 2.7 % (ref 0.0–5.0)
HCT: 43.7 % (ref 39.0–52.0)
Hemoglobin: 14.7 g/dL (ref 13.0–17.0)
MCHC: 33.6 g/dL (ref 30.0–36.0)
MCV: 92.8 fl (ref 78.0–100.0)
Monocytes Absolute: 0.7 10*3/uL (ref 0.1–1.0)
Neutro Abs: 2.9 10*3/uL (ref 1.4–7.7)
RBC: 4.71 Mil/uL (ref 4.22–5.81)
WBC: 5.3 10*3/uL (ref 4.5–10.5)

## 2011-02-23 LAB — LIPID PANEL
HDL: 63.2 mg/dL (ref >39.00)
Triglycerides: 63 mg/dL (ref 0.0–149.0)

## 2011-02-23 LAB — PSA: PSA: 0.52 ng/mL (ref 0.10–4.00)

## 2011-03-26 ENCOUNTER — Encounter: Payer: Self-pay | Admitting: Family Medicine

## 2011-03-26 ENCOUNTER — Ambulatory Visit (INDEPENDENT_AMBULATORY_CARE_PROVIDER_SITE_OTHER): Payer: 59 | Admitting: Family Medicine

## 2011-03-26 DIAGNOSIS — Z0000 Encounter for general adult medical examination without abnormal findings: Secondary | ICD-10-CM

## 2011-03-26 DIAGNOSIS — G4730 Sleep apnea, unspecified: Secondary | ICD-10-CM

## 2011-03-26 DIAGNOSIS — R413 Other amnesia: Secondary | ICD-10-CM

## 2011-03-26 DIAGNOSIS — Z85828 Personal history of other malignant neoplasm of skin: Secondary | ICD-10-CM

## 2011-03-26 MED ORDER — NAPROXEN 375 MG PO TABS: 375.0000 mg | ORAL_TABLET | Freq: Two times a day (BID) | ORAL | Status: DC

## 2011-03-26 NOTE — Progress Notes (Signed)
  Subjective:    Patient ID: Fernando Mcfarland, male    DOB: 08-Jun-1955, 55 y.o.   MRN: [id number]  HPI Fernando Mcfarland is a 55 year old, married male, nonsmoker, who comes in today for general physical examination  He saw his been in excellent health.  He said no chronic health problems except some sleep apnea.  He is followed in pulmonary.  He takes Naprosyn 375 mg b.i.d. For osteoarthritis.  He has a history of skin cancer and would like a thorough skin exam.He lived in [place], [place] for many years  Recently his father, who is 56 years old, was diagnosed to have Alzheimer's disease.  Looking back he thinks his father started have trouble about 10 years ago.  He feels like he has problems with his memory and would like an evaluation  He again is followed in pulmonary for the sleep apnea   Review of Systems  Constitutional: Negative.   HENT: Negative.   Eyes: Negative.   Respiratory: Negative.   Cardiovascular: Negative.   Gastrointestinal: Negative.   Genitourinary: Negative.   Musculoskeletal: Negative.   Skin: Negative.   Neurological: Negative.   Hematological: Negative.   Psychiatric/Behavioral: Negative.        Objective:   Physical Exam  Constitutional: He is oriented to person, place, and time. He appears well-developed and well-nourished.  HENT:  Head: Normocephalic and atraumatic.  Right Ear: External ear normal.  Left Ear: External ear normal.  Nose: Nose normal.  Mouth/Throat: Oropharynx is clear and moist.  Eyes: Conjunctivae and EOM are normal. Pupils are equal, round, and reactive to light.  Neck: Normal range of motion. Neck supple. No JVD present. No tracheal deviation present. No thyromegaly present.  Cardiovascular: Normal rate, regular rhythm, normal heart sounds and intact distal pulses.  Exam reveals no gallop and no friction rub.   No murmur heard. Pulmonary/Chest: Effort normal and breath sounds normal. No stridor. No respiratory distress. He has no  wheezes. He has no rales. He exhibits no tenderness.  Abdominal: Soft. Bowel sounds are normal. He exhibits no distension and no mass. There is no tenderness. There is no rebound and no guarding.  Genitourinary: Rectum normal, prostate normal and penis normal. Guaiac negative stool. No penile tenderness.  Musculoskeletal: Normal range of motion. He exhibits no edema and no tenderness.  Lymphadenopathy:    He has no cervical adenopathy.  Neurological: He is alert and oriented to person, place, and time. He has normal reflexes. No cranial nerve deficit. He exhibits normal muscle tone.  Skin: Skin is warm and dry. No rash noted. No erythema. No pallor.       One lesion on his right wrist, and a second lesion, left elbow appear to be irritated actinic keratosis.  Advised to return for removal  Psychiatric: He has a normal mood and affect. His behavior is normal. Judgment and thought content normal.          Assessment & Plan:  Healthy male.  History of sleep apnea.  Continue CPAP and follow up in pulmonary.  Osteoarthritis.  Naprosyn 375 mg b.i.d.  Two abnormal appearing lesions.  Return for removal.  Family history of Alzheimer's disease.  Neurologic consult with Dr. Vonzell Schlatter.

## 2011-03-26 NOTE — Patient Instructions (Signed)
Continue your current medications.  Return after the new year for a 30 minute appointment for removal of the two lesions.  We discussed.  I will get to set up a consult with our neurologist, Dr. Denton Meek.  Return in one year for your general exam sooner if any problems

## 2011-03-27 ENCOUNTER — Encounter: Payer: Self-pay | Admitting: Neurology

## 2011-03-30 IMAGING — CT CT HEAD W/O CM
1 series · 16 of 30 positions shown, 20 images · non-contrast
Comparison: 12/13/2009

CLINICAL DATA: Chronic sinus pain, abnormal sinus series with right
petrous region sclerosis by plain radiography

CT HEAD WITHOUT CONTRAST
TECHNIQUE: Contiguous axial images were obtained from the base of
the skull through the vertex without contrast.

[Series 2: head_seq -c 4.5 h37s st · axial · 0.47mm/px · z∈[-120,+28]mm · 16 of 36 slices shown, 20 images]
[im 2/36  brain]
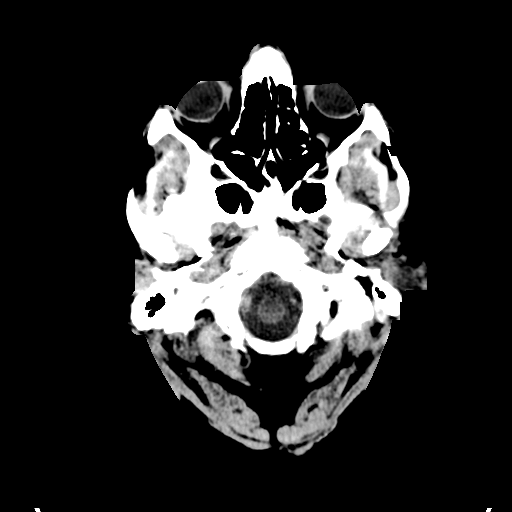
[im 2/36  bone]
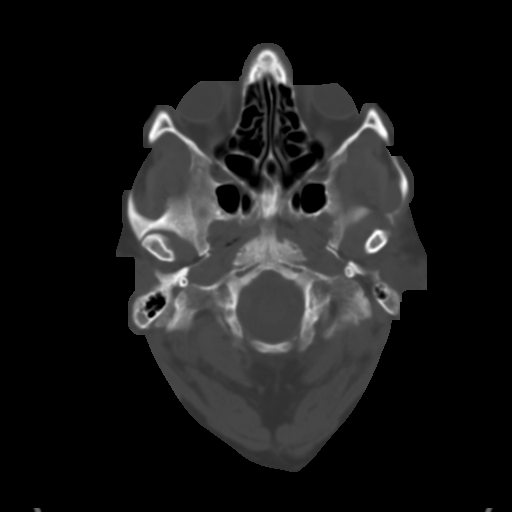
[im 4/36  brain]
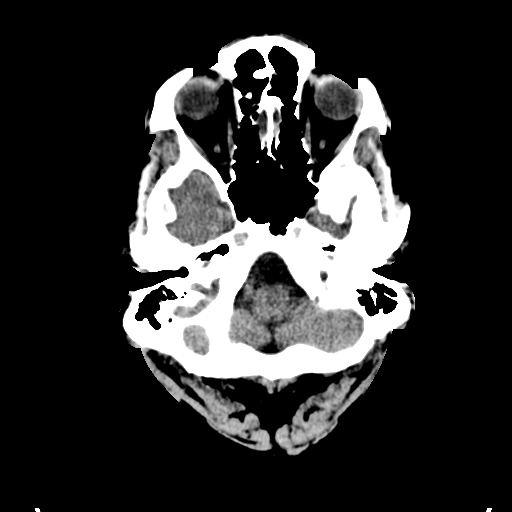
[im 7/36  brain]
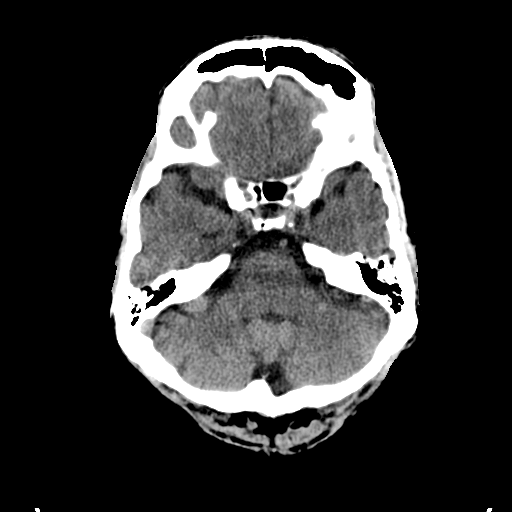
[im 9/36  brain]
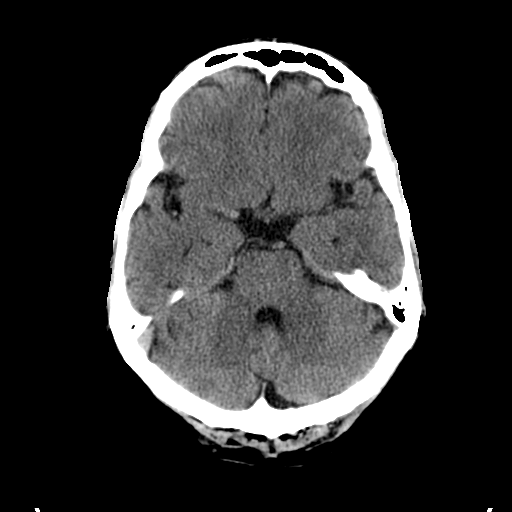
[im 10/36  brain]
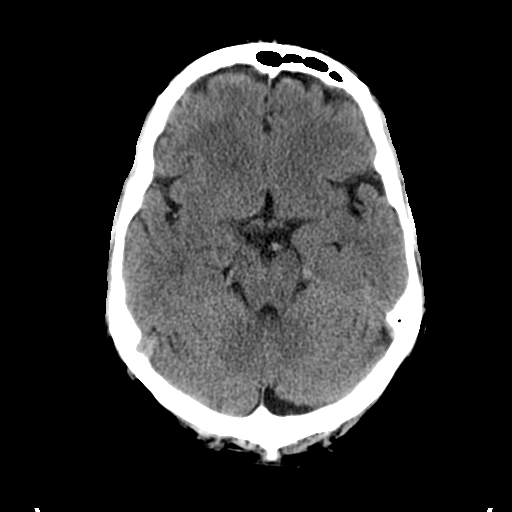
[im 10/36  bone]
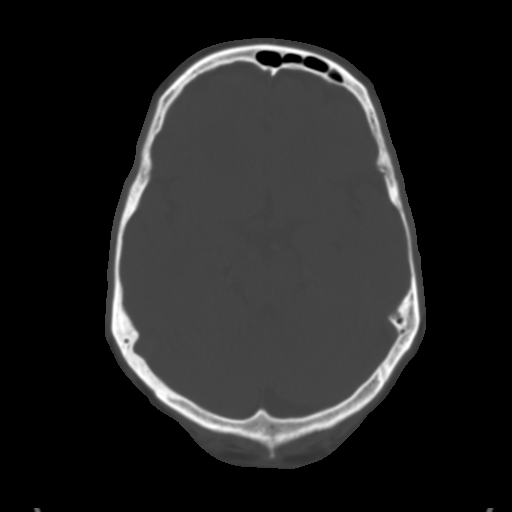
[im 13/36  brain]
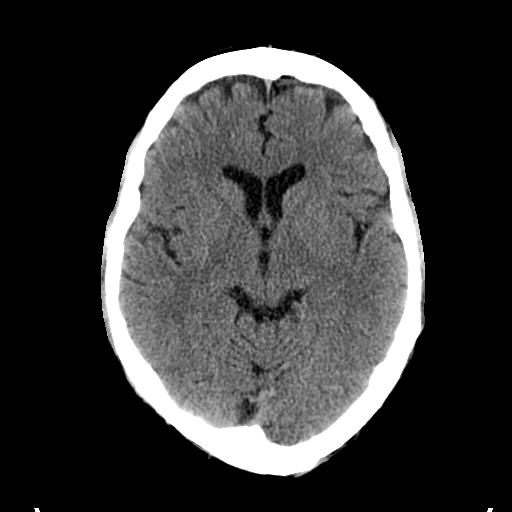
[im 15/36  brain]
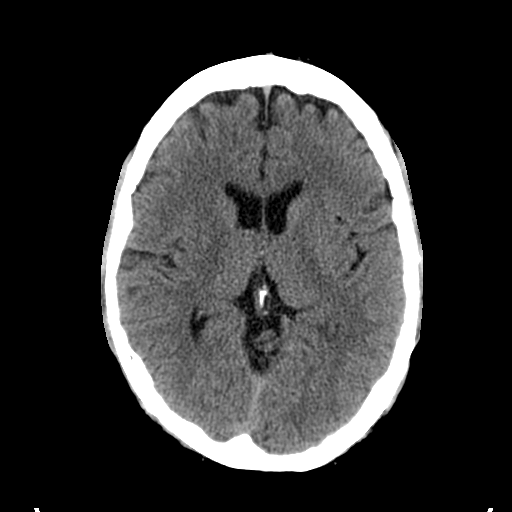
[im 17/36  brain]
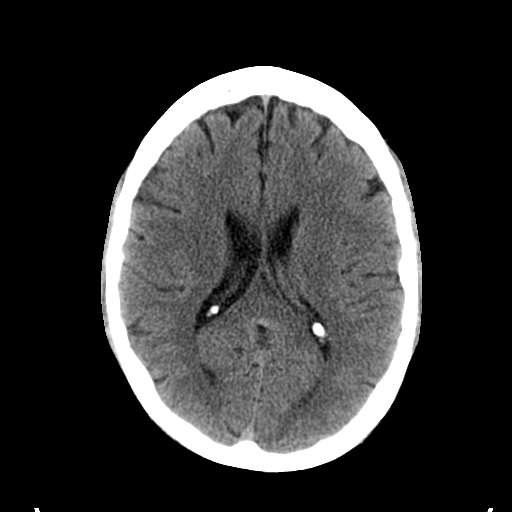
[im 19/36  brain]
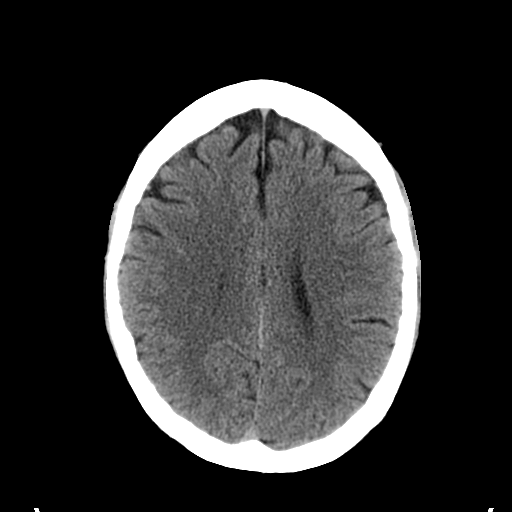
[im 19/36  bone]
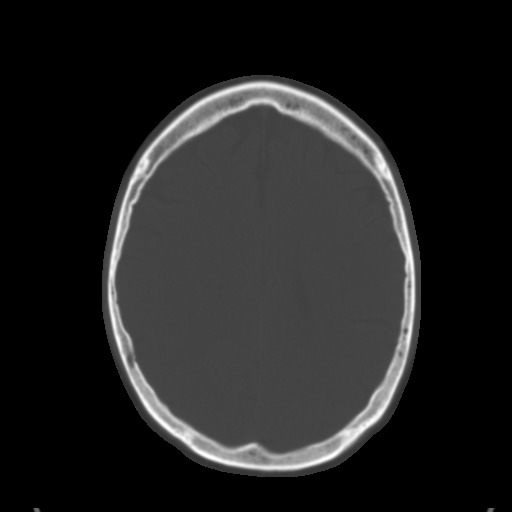
[im 21/36  brain]
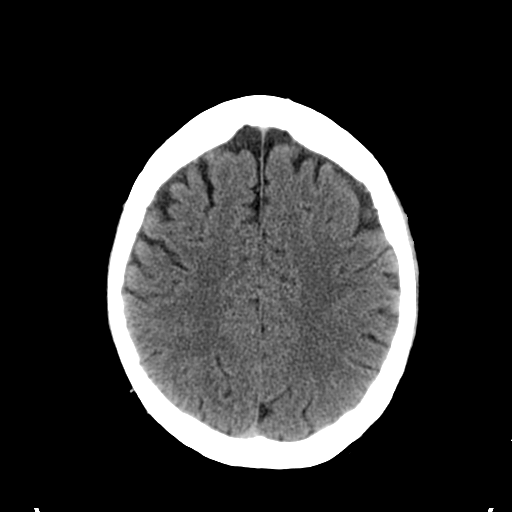
[im 23/36  brain]
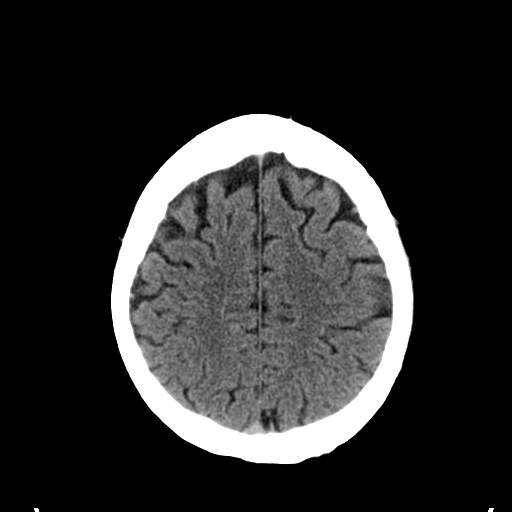
[im 26/36  brain]
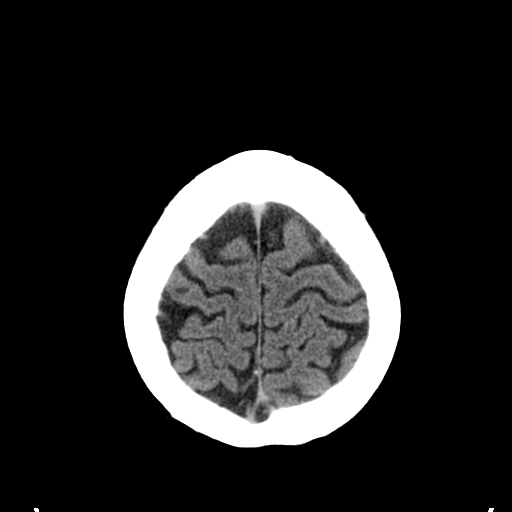
[im 27/36  brain]
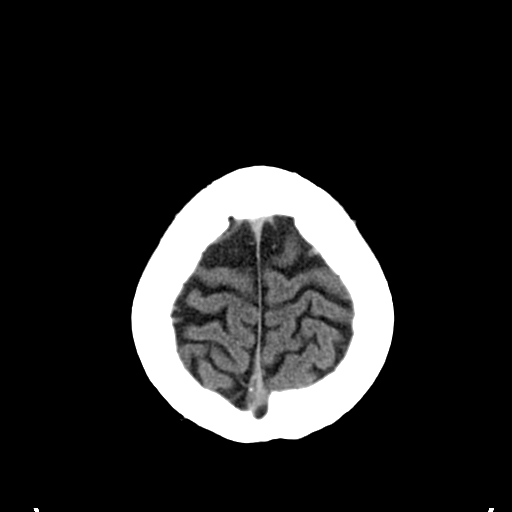
[im 27/36  bone]
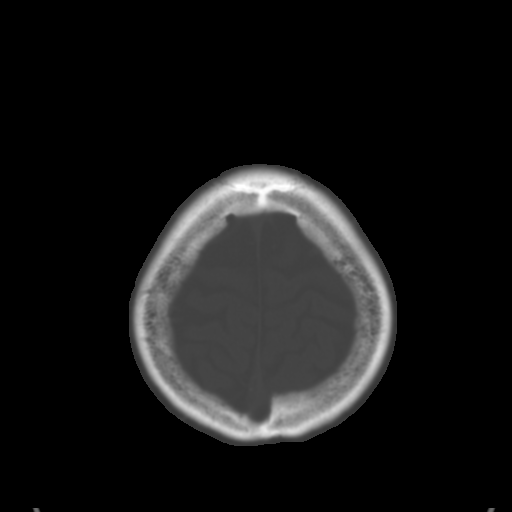
[im 29/36  brain]
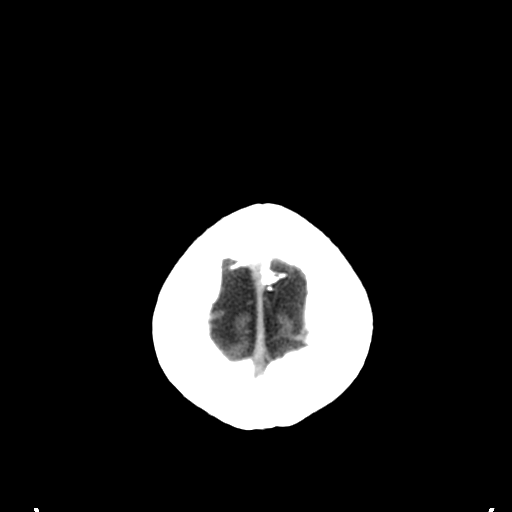
[im 32/36  brain]
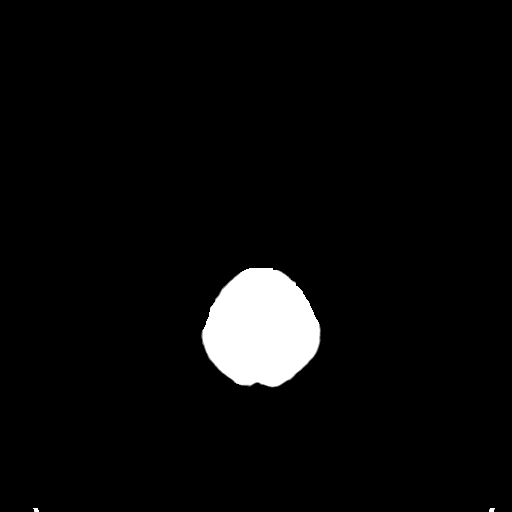
[im 34/36  brain]
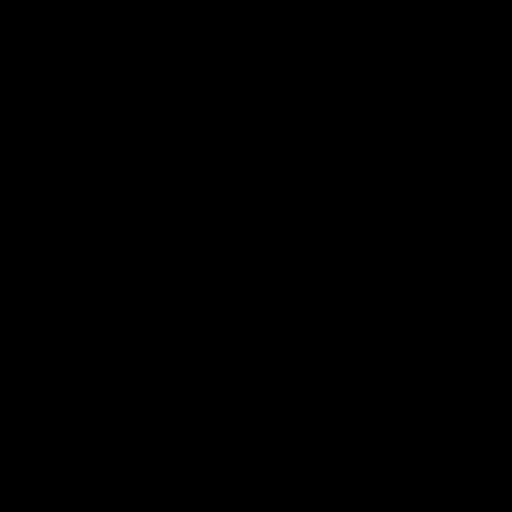

[16 of 30 positions shown; findings below may reference images not displayed]

FINDINGS: Mild age-related brain atrophy.  No acute intracranial
hemorrhage, mass lesion, infarction, midline shift, hydrocephalus,
or extra-axial fluid collection.  Gray-white matter differentiation
maintained.  Cisterns patent.  No cerebellar abnormality.

Mastoids are symmetric and clear.  No abnormal sclerosis of the
petrous bone on either side.  Sinuses clear.
IMPRESSION: No acute intracranial finding.

## 2011-04-13 ENCOUNTER — Ambulatory Visit: Payer: 59 | Admitting: Neurology

## 2011-04-14 ENCOUNTER — Ambulatory Visit: Payer: 59 | Admitting: Family Medicine

## 2011-04-20 ENCOUNTER — Ambulatory Visit (INDEPENDENT_AMBULATORY_CARE_PROVIDER_SITE_OTHER): Payer: 59 | Admitting: Family Medicine

## 2011-04-20 ENCOUNTER — Encounter: Payer: Self-pay | Admitting: Family Medicine

## 2011-04-20 DIAGNOSIS — L570 Actinic keratosis: Secondary | ICD-10-CM | POA: Insufficient documentation

## 2011-04-20 DIAGNOSIS — E1165 Type 2 diabetes mellitus with hyperglycemia: Secondary | ICD-10-CM

## 2011-04-20 DIAGNOSIS — R05 Cough: Secondary | ICD-10-CM

## 2011-04-20 NOTE — Patient Instructions (Signed)
Remove the Band-Aid tomorrow.  Return p.r.n..  Within two weeks.  We will call you the report

## 2011-04-20 NOTE — Progress Notes (Signed)
  Subjective:    Patient ID: Fernando Mcfarland, male    DOB: 11/04/1955, 56 y.o.   MRN: [id number]  HPI Fernando Mcfarland is a 56 year old, married man, nonsmoker, who comes in today for removal of two lesions.  Lesion number one is on his right wrist.  It is 5 mm times 5 mm elevated crusty lesion clinically it appears to be an actinic keratosis.  Lesion number two left forearm measures 5 mm x 5 mmd elevated white lesion again consistent with a possible actinic keratosis.  After informed consent, the lesion was cleaned with alcohol and anesthetized with 1% Xylocaine with epinephrine.  Both lesions were excised with 2-mm margins.  The bases were cauterized.  Dry, sterile dressings were applied.  He tolerated the procedure.  No complications.  Both lesions were sent for pathologic analysis   Review of Systems    History of previous lesions removed.  ............. history of skin cancer Objective:   Physical Exam Procedure see above       Assessment & Plan:  Lesions removed x 2 as noted above.  Probable actinic keratoses, awaiting path

## 2011-05-20 ENCOUNTER — Encounter: Payer: Self-pay | Admitting: Neurology

## 2011-05-20 ENCOUNTER — Ambulatory Visit (INDEPENDENT_AMBULATORY_CARE_PROVIDER_SITE_OTHER): Payer: 59 | Admitting: Neurology

## 2011-05-20 VITALS — BP 130/80 | HR 80 | Wt 218.0 lb

## 2011-05-20 DIAGNOSIS — R413 Other amnesia: Secondary | ICD-10-CM

## 2011-05-20 NOTE — Progress Notes (Signed)
Dear Dr. Tawanna Cooler,  Thank you for having me see Fernando Mcfarland in consultation today at [facility] for his problem with memory.  As you may recall, he is a 56 y.o. year old male with a history of sleep apnea who presents with some complains about memory, although he is most concerned about his family history of dementia.  He says that he has problems remembering complex system processes related to work.  However, he typically deals with 200-300 emails a day, and has had progressive increase in his responsibilities at work.  He has had no performance problems at work.  His wife notices he can ask the same questions multiple times, but has noticed no other problems.  His father and aunt have what sounds like Alzheimer's disease.  They acquired it in their late 85s.  His sister who is older has no memory problems.  He does not note any significant behavior changes with his father or aunt.  He has noted no hallucinations or gait changes.  Past Medical History  Diagnosis Date  . Cholelithiasis   . Diverticulitis   . Skin cancer   - no head trauma, strokes or seizures  Past Surgical History  Procedure Date  . Cholecystectomy   . Tonsillectomy   . Colonoscopy w/ polypectomy     History   Social History  . Marital Status: Married    Spouse Name: N/A    Number of Children: N/A  . 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 Smoking status: Former Smoker    Quit date: 05/19/1990  . Smokeless tobacco: Former Neurosurgeon    Types: Snuff    Quit date: 05/19/1990  . Alcohol Use: Yes  . Drug Use: No  . Sexually Active: None   Other Topics Concern  . None   Social History Narrative  . None    Family History  Problem Relation Age of Onset  . Alzheimer's disease Father   . Heart disease Mother      bundle branch block  . Emphysema Paternal Grandmother   . Breast cancer Mother   - paternal aunt, alzheimer's  Current Outpatient  Prescriptions on File Prior to Visit  Medication Sig Dispense Refill  . naproxen (NAPROSYN) 375 MG tablet Take 1 tablet (375 mg total) by mouth 2 (two) times daily with a meal.  200 tablet  3    No Known Allergies    ROS:  13 systems were reviewed and are notable for arthritis.  All other review of systems are unremarkable.   Examination:  Filed Vitals:   05/20/11 0814  BP: 130/80  Pulse: 80  Weight: 218 lb (98.884 kg)     In general, well appearing man.  Cardiovascular: The patient has a regular rate and rhythm and no carotid bruits.  Fundoscopy:  Disks are flat. Vessel caliber within normal limits.  Mental status:   MMSE 27/27.  Cranial Nerves: Pupils are equally round and reactive to light. Visual fields full to confrontation. Extraocular movements are intact without nystagmus. Facial sensation and muscles of mastication are intact. Muscles of facial expression are symmetric. Hearing intact to bilateral finger rub. Tongue protrusion, uvula, palate midline.  Shoulder shrug intact  Motor:  The patient has normal bulk and tone, no pronator drift.  There are no adventitious movements.  5/5 muscle strength bilaterally.  Reflexes:   Biceps  Triceps Brachioradialis Knee Ankle  Right 2+  2+  2+   2+ 2+  Left  2+  2+  2+   2+ 2+  Toes down  Coordination:  Normal finger to nose.  No dysdiadokinesia.  Sensation is intact to temperature and vibration.  Gait and Station are normal.   Romberg is negative   Impression: I am not convinced that Fernando Mcfarland has any memory problems beyond what could be explained by his aging as well as the demands of his work.  I explained that about 5% of Alzheimer's is truly genetic and that with 1 first degree relative affected his risk increases by 2-4x.  I also said that there are genetic tests for the 5%, but there are no preventatives at this time(although this will likely change in the next five years.)    Recommendations: He can  return to see me on an as needed basis.  Thank you for having [place] see Fernando Mcfarland in consultation.  Feel free to contact me with any questions.  Lupita Raider Modesto Charon, MD [facility], [place] [address] [place], [place] [address] Phone: [phone] Fax: [phone].

## 2013-06-19 ENCOUNTER — Other Ambulatory Visit: Payer: Self-pay | Admitting: Dermatology

## 2015-03-18 ENCOUNTER — Encounter: Payer: Self-pay | Admitting: Internal Medicine

## 2015-04-07 DIAGNOSIS — C4490 Unspecified malignant neoplasm of skin, unspecified: Secondary | ICD-10-CM

## 2015-04-07 HISTORY — DX: Unspecified malignant neoplasm of skin, unspecified: C44.90

## 2015-05-10 ENCOUNTER — Encounter: Payer: Self-pay | Admitting: *Deleted

## 2015-05-20 ENCOUNTER — Ambulatory Visit (INDEPENDENT_AMBULATORY_CARE_PROVIDER_SITE_OTHER): Payer: 59 | Admitting: Internal Medicine

## 2015-05-20 ENCOUNTER — Encounter: Payer: Self-pay | Admitting: Internal Medicine

## 2015-05-20 VITALS — BP 114/74 | HR 76 | Ht 71.0 in | Wt 216.4 lb

## 2015-05-20 DIAGNOSIS — Z86010 Personal history of colonic polyps: Secondary | ICD-10-CM | POA: Diagnosis not present

## 2015-05-20 DIAGNOSIS — Z8719 Personal history of other diseases of the digestive system: Secondary | ICD-10-CM

## 2015-05-20 DIAGNOSIS — K432 Incisional hernia without obstruction or gangrene: Secondary | ICD-10-CM

## 2015-05-20 MED ORDER — NA SULFATE-K SULFATE-MG SULF 17.5-3.13-1.6 GM/177ML PO SOLN: ORAL | Status: DC

## 2015-05-20 NOTE — Progress Notes (Signed)
Patient ID: Fernando Mcfarland, male   DOB: 01-29-1956, 60 y.o.   MRN: [id number] HPI: Fernando Mcfarland is a 60 year old male with a past medical history of complicated diverticulitis status post sigmoid resection in 1990, history of colon polyps who is seen in consultation at the request of Dr. Osborne Casco to consider repeat colorectal cancer screening/surveillance. He is here alone today. He reports he is doing well currently. He does have a history of complicated diverticular abscess with microperforation in 1990 requiring sigmoid colectomy. He did not have colostomy and his surgery was done in a 1 stage procedure. He reports having developed an incisional hernia afterwards which does not bother him. He recalls a history of colon polyps over multiple prior colonoscopies all performed in [place]. His last colonoscopy was in October 2009. We have a statement regarding this colonoscopy as normal other than diverticulosis coli. He reports with polyps he was initially on the three-year plan then 5 year plan and is now overdue.  He denies a family history of colorectal cancer or IBD. He does have a family history of breast cancer and heart disease.  Past Medical History  Diagnosis Date  . Cholelithiasis   . Diverticulitis   . Skin cancer   . Umbilical hernia   . Colonic diverticular abscess   . Colon polyps     Past Surgical History  Procedure Laterality Date  . Cholecystectomy    . Tonsillectomy    . Colonoscopy w/ polypectomy    . Sigmoid colon resection      Outpatient Prescriptions Prior to Visit  Medication Sig Dispense Refill  . aspirin 81 MG chewable tablet Chew 81 mg by mouth daily.    . naproxen (NAPROSYN) 375 MG tablet Take 1 tablet (375 mg total) by mouth 2 (two) times daily with a meal. 200 tablet 3   No facility-administered medications prior to visit.    No Known Allergies  Family History  Problem Relation Age of Onset  . Alzheimer's disease Father   . Heart disease Mother      bundle branch block  . Emphysema Paternal Grandfather   . Breast cancer Mother     Social History  Substance Use Topics  . Smoking status: Smoker, Current Status Unknown    Last Attempt to Quit: 05/19/1990  . Smokeless tobacco: Former Systems developer    Types: Snuff    Quit date: 05/19/1990  . Alcohol Use: 0.0 oz/week    0 Standard drinks or equivalent per week     Comment: 3 beer s per day    ROS: As per history of present illness, otherwise negative  BP 114/74 mmHg  Pulse 76  Ht 5\' 11"  (1.803 m)  Wt 216 lb 6 oz (98.147 kg)  BMI 30.19 kg/m2 Constitutional: Well-developed and well-nourished. No distress. HEENT: Normocephalic and atraumatic. Oropharynx is clear and moist. No oropharyngeal exudate. Conjunctivae are normal.  No scleral icterus. Neck: Neck supple. Trachea midline. Cardiovascular: Normal rate, regular rhythm and intact distal pulses. No M/R/G Pulmonary/chest: Effort normal and breath sounds normal. No wheezing, rales or rhonchi. Abdominal: Soft, nontender, nondistended. Bowel sounds active throughout. Vertical incision from umbilicus to pubic symphysis with small reducible hernia at the cephalad portion of incisional scar which is nontender Extremities: no clubbing, cyanosis, or edema Neurological: Alert and oriented to person place and time. Skin: Skin is warm and dry. Psychiatric: Normal mood and affect. Behavior is normal.  PCP records reviewed  ASSESSMENT/PLAN:  60 year old male with a past medical history of complicated diverticulitis  status post sigmoid resection in 1990, history of colon polyps who is seen in consultation at the request of Dr. Osborne Casco to consider repeat colorectal cancer screening/surveillance.  1. Hx of colonic polyps -- given history of colon polyps, presumed adenomatous given that he was having surveillance every 3 years and then every 5 years, I have recommended repeat surveillance colonoscopy at this time. We discussed the risk of benefits and  alternatives and he is agreeable to proceed.   2. History of diverticular abscess status post sigmoid resection -- no issues with recurrent diverticulitis since surgery 25 years ago. He does have diverticulosis even after resection of sigmoid but luckily has not had further complication or infection.  3. Incisional hernia -- asymptomatic. He is asked to notify me if he develops pain at hernia and if so would be sent for surgical referral.    [id number] [address], [address] [place], [place] [address]

## 2015-05-20 NOTE — Patient Instructions (Signed)

## 2015-05-30 ENCOUNTER — Encounter: Payer: Self-pay | Admitting: Internal Medicine

## 2015-05-30 ENCOUNTER — Telehealth: Payer: Self-pay | Admitting: Gastroenterology

## 2015-05-30 ENCOUNTER — Ambulatory Visit (AMBULATORY_SURGERY_CENTER): Payer: 59 | Admitting: Internal Medicine

## 2015-05-30 VITALS — BP 129/80 | HR 66 | Temp 98.4°F | Resp 16 | Ht 71.0 in | Wt 216.0 lb

## 2015-05-30 DIAGNOSIS — Z86010 Personal history of colonic polyps: Secondary | ICD-10-CM | POA: Diagnosis not present

## 2015-05-30 DIAGNOSIS — D120 Benign neoplasm of cecum: Secondary | ICD-10-CM

## 2015-05-30 MED ORDER — SODIUM CHLORIDE 0.9 % IV SOLN: 500.0000 mL | INTRAVENOUS | Status: DC

## 2015-05-30 NOTE — Progress Notes (Signed)
Called to room to assist during endoscopic procedure.  Patient ID and intended procedure confirmed with present staff. Received instructions for my participation in the procedure from the performing physician.  

## 2015-05-30 NOTE — Patient Instructions (Signed)
YOU HAD AN ENDOSCOPIC PROCEDURE TODAY AT THE [facility]:   Refer to the procedure report that was given to you for any specific questions about what was found during the examination.  If the procedure report does not answer your questions, please call your gastroenterologist to clarify.  If you requested that your care partner not be given the details of your procedure findings, then the procedure report has been included in a sealed envelope for you to review at your convenience later.  YOU SHOULD EXPECT: Some feelings of bloating in the abdomen. Passage of more gas than usual.  Walking can help get rid of the air that was put into your GI tract during the procedure and reduce the bloating. If you had a lower endoscopy (such as a colonoscopy or flexible sigmoidoscopy) you may notice spotting of blood in your stool or on the toilet paper. If you underwent a bowel prep for your procedure, you may not have a normal bowel movement for a few days.  Please Note:  You might notice some irritation and congestion in your nose or some drainage.  This is from the oxygen used during your procedure.  There is no need for concern and it should clear up in a day or so.  SYMPTOMS TO REPORT IMMEDIATELY:   Following lower endoscopy (colonoscopy or flexible sigmoidoscopy):  Excessive amounts of blood in the stool  Significant tenderness or worsening of abdominal pains  Swelling of the abdomen that is new, acute  Fever of 100F or higher    For urgent or emergent issues, a gastroenterologist can be reached at any hour by calling [phone].   DIET: Your first meal following the procedure should be a small meal and then it is ok to progress to your normal diet. Heavy or fried foods are harder to digest and may make you feel nauseous or bloated.  Likewise, meals heavy in dairy and vegetables can increase bloating.  Drink plenty of fluids but you should avoid alcoholic beverages for 24  hours.  ACTIVITY:  You should plan to take it easy for the rest of today and you should NOT DRIVE or use heavy machinery until tomorrow (because of the sedation medicines used during the test).    FOLLOW UP: Our staff will call the number listed on your records the next business day following your procedure to check on you and address any questions or concerns that you may have regarding the information given to you following your procedure. If we do not reach you, we will leave a message.  However, if you are feeling well and you are not experiencing any problems, there is no need to return our call.  We will assume that you have returned to your regular daily activities without incident.  If any biopsies were taken you will be contacted by phone or by letter within the next 1-3 weeks.  Please call us at [phone] if you have not heard about the biopsies in 3 weeks.    SIGNATURES/CONFIDENTIALITY: You and/or your care partner have signed paperwork which will be entered into your electronic medical record.  These signatures attest to the fact that that the information above on your After Visit Summary has been reviewed and is understood.  Full responsibility of the confidentiality of this discharge information lies with you and/or your care-partner.   Information on polyps,diverticulosis ,and high fiber diet given to you today 

## 2015-05-30 NOTE — Progress Notes (Signed)
Report to PACU, RN, vss, BBS= Clear.  

## 2015-05-30 NOTE — Op Note (Signed)
[place]  [employer] [place], [id number]   COLONOSCOPY PROCEDURE REPORT  PATIENT: Fernando Mcfarland, Fernando Mcfarland  MR#: [id number] BIRTHDATE: [birth year] , 59  yrs. old GENDER: male ENDOSCOPIST: Eustace Quail, MD REFERRED [id number] Todd, MD PROCEDURE DATE:  05/30/2015 PROCEDURE:   Colonoscopy, surveillance , Colonoscopy with snare polypectomy, and Submucosal injection, any substance First Screening Colonoscopy - Avg.  risk and is 50 yrs.  old or older - No.  Prior Negative Screening - Now for repeat screening. N/A  History of Adenoma - Now for follow-up colonoscopy & has been > or = to 3 yrs.  Yes hx of adenoma.  Has been 3 or more years since last colonoscopy.  Polyps removed today? Yes ASA CLASS:   Class II INDICATIONS:Surveillance due to prior colonic neoplasia and PH Colon Adenoma.Multiple prior in [facility] (see JMP note 05-20-15) MEDICATIONS: Monitored anesthesia care and Propofol 360 mg IV DESCRIPTION OF PROCEDURE:   After the risks benefits and alternatives of the procedure were thoroughly explained, informed consent was obtained.  The digital rectal exam revealed no abnormalities of the rectum.   The [id number]  endoscope was introduced through the anus and advanced to the cecum, which was identified by both the appendix and ileocecal valve. No adverse events experienced.   The quality of the prep was excellent. (Suprep was used)  The instrument was then slowly withdrawn as the colon was fully examined. Estimated blood loss is zero unless otherwise noted in this procedure report.  COLON FINDINGS: Severe diverticulosis throughout.Surgical anastomosis at 20 cm. 1.8 cm sessile polyp in wide mouthed diverticulum just outside cecum other side ICV.  Lesion raised with normal saline and removed piecemeal with cold snare.  Resection felt to be complete.  Tissue retrieved and submitted.  Ink tattoo placed just below lesion (see photo).  No other  abnormalities. Retroflexed views revealed no abnormalities. To cecum = 1.6 Withdrawal = 21.0   The scope was withdrawn and the procedure completed. COMPLICATIONS: There were no immediate complications. ENDOSCOPIC IMPRESSION: 1. Complex polyp removed as described 2. Severe pandiverticulosis and prior sigmoid resection RECOMMENDATIONS: 1. Repeat Colonoscopy in 1 year.  eSigned:  Eustace Quail, MD 05/30/2015 10:01 AM cc: The Patient, Dorena Cookey, MD, and Zenovia Jarred MD   PATIENT NAME:  [name], [name] MR#: [id number]

## 2015-05-30 NOTE — Telephone Encounter (Signed)
Patient called with c/o some chills, checked his temp was 100 and also has a mild headache. Denies any abdominal pain or blood per rectum. Reassured patient and advised him to maintain adequate hydration and to take tylenol if needed

## 2015-05-30 NOTE — Telephone Encounter (Signed)
Noted. Complex polyp in diverticulum removed. [facility] nurse team should have routine follow-up in a.m. Thanks

## 2015-05-31 ENCOUNTER — Telehealth: Payer: Self-pay | Admitting: Emergency Medicine

## 2015-05-31 NOTE — Telephone Encounter (Signed)
Thanks for update. Glad he's feeling better

## 2015-05-31 NOTE — Telephone Encounter (Signed)
  Follow up Call-  Call back number 05/30/2015  Post procedure Call Back phone  # [phone]  Permission to leave phone message Yes     Patient questions:  Do you have a fever, pain , or abdominal swelling? No. Pain Score  0 *  Have you tolerated food without any problems? Yes.    Have you been able to return to your normal activities? Yes.    Do you have any questions about your discharge instructions: Diet   No. Medications  No. Follow up visit  No.  Do you have questions or concerns about your Care? Yes.    Actions: * If pain score is 4 or above: No action needed, pain <4.

## 2015-06-07 ENCOUNTER — Encounter: Payer: Self-pay | Admitting: Internal Medicine

## 2015-12-11 ENCOUNTER — Telehealth: Payer: Self-pay | Admitting: Internal Medicine

## 2015-12-11 NOTE — Telephone Encounter (Signed)
Dr. Hilarie Fredrickson please see note regarding pt wanting colon early due to meeting deductible. Please advise.

## 2015-12-11 NOTE — Telephone Encounter (Signed)
Yes, okay to schedule for nov or dec 2017

## 2015-12-12 ENCOUNTER — Encounter: Payer: Self-pay | Admitting: Internal Medicine

## 2015-12-12 NOTE — Telephone Encounter (Signed)
Colonoscopy scheduled for November.

## 2016-01-24 ENCOUNTER — Ambulatory Visit: Payer: 59 | Admitting: *Deleted

## 2016-01-24 VITALS — Ht 72.0 in | Wt 210.8 lb

## 2016-01-24 DIAGNOSIS — Z86010 Personal history of colonic polyps: Secondary | ICD-10-CM

## 2016-01-24 MED ORDER — NA SULFATE-K SULFATE-MG SULF 17.5-3.13-1.6 GM/177ML PO SOLN: 1.0000 | Freq: Once | ORAL | 0 refills | Status: AC

## 2016-01-24 NOTE — Progress Notes (Signed)
No allergies to eggs or soy. No problems with anesthesia.  Pt given Emmi instructions for colonoscopy  No oxygen use  No diet drug use  

## 2016-01-27 ENCOUNTER — Encounter: Payer: Self-pay | Admitting: Internal Medicine

## 2016-02-07 ENCOUNTER — Ambulatory Visit (AMBULATORY_SURGERY_CENTER): Payer: 59 | Admitting: Internal Medicine

## 2016-02-07 ENCOUNTER — Encounter: Payer: Self-pay | Admitting: Internal Medicine

## 2016-02-07 VITALS — BP 126/70 | HR 69 | Temp 97.1°F | Resp 12 | Ht 72.0 in | Wt 210.0 lb

## 2016-02-07 DIAGNOSIS — D122 Benign neoplasm of ascending colon: Secondary | ICD-10-CM

## 2016-02-07 DIAGNOSIS — Z86010 Personal history of colonic polyps: Secondary | ICD-10-CM | POA: Diagnosis not present

## 2016-02-07 DIAGNOSIS — D120 Benign neoplasm of cecum: Secondary | ICD-10-CM

## 2016-02-07 MED ORDER — SODIUM CHLORIDE 0.9 % IV SOLN: 500.0000 mL | INTRAVENOUS | Status: AC

## 2016-02-07 NOTE — Progress Notes (Signed)
Called to room to assist during endoscopic procedure.  Patient ID and intended procedure confirmed with present staff. Received instructions for my participation in the procedure from the performing physician.  

## 2016-02-07 NOTE — Progress Notes (Signed)
Report to PACU, RN, vss, BBS= Clear.  

## 2016-02-07 NOTE — Op Note (Signed)
[place] Patient Name: Fernando Mcfarland Procedure Date: 02/07/2016 9:28 AM MRN: [id number] Endoscopist: Jerene Bears , MD Age: 60 Referring MD:  Date of Birth: [birth year] Gender: Male Account #: [id number] Procedure:                Colonoscopy Indications:              Surveillance: Personal history of piecemeal removal                            of adenoma on last colonoscopy (less than 1 year                            ago) Medicines:                Monitored Anesthesia Care Procedure:                Pre-Anesthesia Assessment:                           - Prior to the procedure, a History and Physical                            was performed, and patient medications and                            allergies were reviewed. The patient's tolerance of                            previous anesthesia was also reviewed. The risks                            and benefits of the procedure and the sedation                            options and risks were discussed with the patient.                            All questions were answered, and informed consent                            was obtained. Prior Anticoagulants: The patient has                            taken no previous anticoagulant or antiplatelet                            agents. ASA Grade Assessment: II - A patient with                            mild systemic disease. After reviewing the risks                            and benefits, the patient was deemed in  satisfactory condition to undergo the procedure.                           After obtaining informed consent, the colonoscope                            was passed under direct vision. Throughout the                            procedure, the patient's blood pressure, pulse, and                            oxygen saturations were monitored continuously. The                            Model [id number]) scope was introduced                         through the anus and advanced to the the cecum,                            identified by appendiceal orifice and ileocecal                            valve. The colonoscopy was performed without                            difficulty. The patient tolerated the procedure                            well. The quality of the bowel preparation was                            good. The ileocecal valve, appendiceal orifice, and                            rectum were photographed. Scope In: 9:38:15 AM Scope Out: 10:05:29 AM Scope Withdrawal Time: 0 hours 25 minutes 26 seconds  Total Procedure Duration: 0 hours 27 minutes 14 seconds  Findings:                 The digital rectal exam was normal.                           A tattoo was seen in the proximal ascending colon                            across from ICV (where cecum and ascending colon                            meet).                           At the diverticulum adjacent to the tattoo was a  small, approximately 4-5 mm, residual polyp in the                            ascending colon. The polyp was sessile. The polyp                            was removed with a cold snare and additional biopsy                            forceps bites obtained in attempt to ensure                            complete resection. Resection and retrieval were                            felt to be complete.                           Two sessile polyps were found in the ascending                            colon. The polyps were 3 to 4 mm in size. These                            polyps were removed with a cold snare (1) and cold                            forceps (1). Resection and retrieval were complete.                           Multiple small and large-mouthed diverticula were                            found from cecum to sigmoid colon.                           There was evidence of a prior end-to-side                             colo-colonic anastomosis in the sigmoid colon. This                            was patent and was characterized by healthy                            appearing mucosa.                           Internal hemorrhoids were found during                            retroflexion. The hemorrhoids were small. Complications:            No immediate complications. Estimated Blood Loss:  Estimated blood loss was minimal. Impression:               - A tattoo was seen in the proximal ascending colon                            near diverticulum where prior polypectomy was                            performed.                           - Residual polyp tissue at the edge of diverticulum                            (at prior polypectomy site), removed with a cold                            snare and cold biopsy forceps. Resected and                            retrieved.                           - Two 3 to 4 mm polyps in the ascending colon,                            removed with a cold snare. Resected and retrieved.                           - Moderate diverticulosis from cecum to sigmoid                            colon.                           - Patent end-to-side colo-colonic anastomosis,                            characterized by healthy appearing mucosa.                           - Internal hemorrhoids. Recommendation:           - Patient has a contact number available for                            emergencies. The signs and symptoms of potential                            delayed complications were discussed with the                            patient. Return to normal activities tomorrow.                            Written discharge instructions were provided to  the                            patient.                           - Resume previous diet.                           - Continue present medications.                           - Await pathology results.                            - Repeat colonoscopy is recommended to review the                            polypectomy site in 1 year. The colonoscopy date                            will be determined after pathology results from                            today's exam become available for review. Jerene Bears, MD 02/07/2016 10:14:27 AM This report has been signed electronically.

## 2016-02-07 NOTE — Patient Instructions (Signed)
YOU HAD AN ENDOSCOPIC PROCEDURE TODAY AT [facility]:   Refer to the procedure report that was given to you for any specific questions about what was found during the examination.  If the procedure report does not answer your questions, please call your gastroenterologist to clarify.  If you requested that your care partner not be given the details of your procedure findings, then the procedure report has been included in a sealed envelope for you to review at your convenience later.  YOU SHOULD EXPECT: Some feelings of bloating in the abdomen. Passage of more gas than usual.  Walking can help get rid of the air that was put into your GI tract during the procedure and reduce the bloating. If you had a lower endoscopy (such as a colonoscopy or flexible sigmoidoscopy) you may notice spotting of blood in your stool or on the toilet paper. If you underwent a bowel prep for your procedure, you may not have a normal bowel movement for a few days.  Please Note:  You might notice some irritation and congestion in your nose or some drainage.  This is from the oxygen used during your procedure.  There is no need for concern and it should clear up in a day or so.  SYMPTOMS TO REPORT IMMEDIATELY:   Following lower endoscopy (colonoscopy or flexible sigmoidoscopy):  Excessive amounts of blood in the stool  Significant tenderness or worsening of abdominal pains  Swelling of the abdomen that is new, acute  Fever of 100F or higher   Following upper endoscopy (EGD)  Vomiting of blood or coffee ground material  New chest pain or pain under the shoulder blades  Painful or persistently difficult swallowing  New shortness of breath  Fever of 100F or higher  Black, tarry-looking stools  For urgent or emergent issues, a gastroenterologist can be reached at any hour by calling [phone].   DIET:  We do recommend a small meal at first, but then you may proceed to your regular diet.  Drink  plenty of fluids but you should avoid alcoholic beverages for 24 hours.  ACTIVITY:  You should plan to take it easy for the rest of today and you should NOT DRIVE or use heavy machinery until tomorrow (because of the sedation medicines used during the test).    FOLLOW UP: Our staff will call the number listed on your records the next business day following your procedure to check on you and address any questions or concerns that you may have regarding the information given to you following your procedure. If we do not reach you, we will leave a message.  However, if you are feeling well and you are not experiencing any problems, there is no need to return our call.  We will assume that you have returned to your regular daily activities without incident.  If any biopsies were taken you will be contacted by phone or by letter within the next 1-3 weeks.  Please call us at [phone] if you have not heard about the biopsies in 3 weeks.    SIGNATURES/CONFIDENTIALITY: You and/or your care partner have signed paperwork which will be entered into your electronic medical record.  These signatures attest to the fact that that the information above on your After Visit Summary has been reviewed and is understood.  Full responsibility of the confidentiality of this discharge information lies with you and/or your care-partner.  Polyp, diverticulosis, and hemorrhoid handouts given.

## 2016-02-10 ENCOUNTER — Telehealth: Payer: Self-pay | Admitting: *Deleted

## 2016-02-10 NOTE — Telephone Encounter (Signed)
  Follow up Call-  Call back number 02/07/2016 05/30/2015  Post procedure Call Back phone  # [phone]  Permission to leave phone message Yes Yes  Some recent data might be hidden     Patient questions:  Do you have a fever, pain , or abdominal swelling? No. Pain Score  0 *  Have you tolerated food without any problems? Yes.    Have you been able to return to your normal activities? Yes.    Do you have any questions about your discharge instructions: Diet   No. Medications  No. Follow up visit  No.  Do you have questions or concerns about your Care? No.  Actions: * If pain score is 4 or above: No action needed, pain <4.

## 2016-02-13 ENCOUNTER — Encounter: Payer: Self-pay | Admitting: Internal Medicine

## 2016-03-24 ENCOUNTER — Telehealth: Payer: Self-pay | Admitting: Internal Medicine

## 2016-03-24 NOTE — Telephone Encounter (Signed)
Reviewed path letter with patient and put another one in the mail to him. Pt aware.

## 2016-08-18 ENCOUNTER — Telehealth: Payer: Self-pay | Admitting: Internal Medicine

## 2016-08-18 NOTE — Telephone Encounter (Signed)
Pt is due for recall colon in November for a 1 year recall. Pt is moving to [place] in September and wants to know if he can have colon before he moves or if it is to soon. Please advise.

## 2016-08-19 NOTE — Telephone Encounter (Signed)
Okay for repeat colonoscopy in September 2018 just before he moves/relocates to [place]

## 2016-08-19 NOTE — Telephone Encounter (Signed)
Ok to schedule colon, see note from Dr. Hilarie Fredrickson.

## 2016-08-19 NOTE — Telephone Encounter (Signed)
Spoke with pt and he is aware and wishes to go ahead and schedule colon. Previsit scheduled for 09/07/16@8 :30am, Colon scheduled for 09/22/16@10 :30am. Pt aware of appts.

## 2016-08-19 NOTE — Telephone Encounter (Signed)
Pt wants to know if it would be possible to have a colon done and if there is a polyp have it removed,and if a section of his colon needs to be removed he would like it done at the same time so he would not have to prep 2 different times. Discussed with pt that Dr. Hilarie Fredrickson can remove the polyp that if part of his colon needed to be removed he would have to see a surgeon. Pt wants to know what are the chances that the polyp has grown back and does Dr. Hilarie Fredrickson think a surgeon here could do a colon and then surgery if needed. Please advise.

## 2016-08-19 NOTE — Telephone Encounter (Signed)
Spoke with patient states before he schedules colonoscopy wants to know if the diverticulitis has grown back and needs to be removed laparoscopically can this be done same day as procedure or does it need to be done another day. Best call back number is [phone].

## 2016-08-19 NOTE — Telephone Encounter (Signed)
My impression is that the polyp has now been removed completely and I think the need for surgical resection for recurrent polyp at the ascending colon diverticulum is very low. That said, I cannot completely exclude possibility of surgical resection I do not know of a surgery that would do both consecutively (meaning same day). I can refer to a colorectal surgeon to discuss if he wishes If not, then okay to proceed to colonoscopy as previously discussed

## 2016-09-04 ENCOUNTER — Ambulatory Visit (AMBULATORY_SURGERY_CENTER): Payer: Self-pay

## 2016-09-04 VITALS — Ht 72.0 in | Wt 214.0 lb

## 2016-09-04 DIAGNOSIS — Z86010 Personal history of colonic polyps: Secondary | ICD-10-CM

## 2016-09-04 MED ORDER — NA SULFATE-K SULFATE-MG SULF 17.5-3.13-1.6 GM/177ML PO SOLN: 1.0000 | Freq: Once | ORAL | 0 refills | Status: AC

## 2016-09-04 NOTE — Progress Notes (Signed)
Denies allergies to eggs or soy products. Denies complication of anesthesia or sedation. Denies use of weight loss medication. Denies use of O2.   Emmi instructions declined patient has already seen it.

## 2016-09-11 ENCOUNTER — Encounter: Payer: Self-pay | Admitting: Internal Medicine

## 2016-09-22 ENCOUNTER — Ambulatory Visit (AMBULATORY_SURGERY_CENTER): Payer: BLUE CROSS/BLUE SHIELD | Admitting: Internal Medicine

## 2016-09-22 ENCOUNTER — Encounter: Payer: Self-pay | Admitting: Internal Medicine

## 2016-09-22 VITALS — BP 129/69 | HR 60 | Temp 97.7°F | Resp 9 | Ht 72.0 in | Wt 214.0 lb

## 2016-09-22 DIAGNOSIS — D122 Benign neoplasm of ascending colon: Secondary | ICD-10-CM

## 2016-09-22 DIAGNOSIS — Z86010 Personal history of colonic polyps: Secondary | ICD-10-CM

## 2016-09-22 MED ORDER — SODIUM CHLORIDE 0.9 % IV SOLN
500.0000 mL | INTRAVENOUS | Status: AC
Start: 2016-09-22 — End: ?

## 2016-09-22 NOTE — Progress Notes (Signed)
Alert and oriented x3, pleased with MAC, report to RN Vinnie Level

## 2016-09-22 NOTE — Patient Instructions (Signed)
Multiple biopsies taken.  Await pathology results to determine how to proceed.    YOU HAD AN ENDOSCOPIC PROCEDURE TODAY AT [facility]:   Refer to the procedure report that was given to you for any specific questions about what was found during the examination.  If the procedure report does not answer your questions, please call your gastroenterologist to clarify.  If you requested that your care partner not be given the details of your procedure findings, then the procedure report has been included in a sealed envelope for you to review at your convenience later.  YOU SHOULD EXPECT: Some feelings of bloating in the abdomen. Passage of more gas than usual.  Walking can help get rid of the air that was put into your GI tract during the procedure and reduce the bloating. If you had a lower endoscopy (such as a colonoscopy or flexible sigmoidoscopy) you may notice spotting of blood in your stool or on the toilet paper. If you underwent a bowel prep for your procedure, you may not have a normal bowel movement for a few days.  Please Note:  You might notice some irritation and congestion in your nose or some drainage.  This is from the oxygen used during your procedure.  There is no need for concern and it should clear up in a day or so.  SYMPTOMS TO REPORT IMMEDIATELY:   Following lower endoscopy (colonoscopy or flexible sigmoidoscopy):  Excessive amounts of blood in the stool  Significant tenderness or worsening of abdominal pains  Swelling of the abdomen that is new, acute  Fever of 100F or higher   For urgent or emergent issues, a gastroenterologist can be reached at any hour by calling [phone].   DIET:  We do recommend a small meal at first, but then you may proceed to your regular diet.  Drink plenty of fluids but you should avoid alcoholic beverages for 24 hours.  ACTIVITY:  You should plan to take it easy for the rest of today and you should NOT DRIVE or use heavy  machinery until tomorrow (because of the sedation medicines used during the test).    FOLLOW UP: Our staff will call the number listed on your records the next business day following your procedure to check on you and address any questions or concerns that you may have regarding the information given to you following your procedure. If we do not reach you, we will leave a message.  However, if you are feeling well and you are not experiencing any problems, there is no need to return our call.  We will assume that you have returned to your regular daily activities without incident.  If any biopsies were taken you will be contacted by phone or by letter within the next 1-3 weeks.  Please call us at [phone] if you have not heard about the biopsies in 3 weeks.    SIGNATURES/CONFIDENTIALITY: You and/or your care partner have signed paperwork which will be entered into your electronic medical record.  These signatures attest to the fact that that the information above on your After Visit Summary has been reviewed and is understood.  Full responsibility of the confidentiality of this discharge information lies with you and/or your care-partner.

## 2016-09-22 NOTE — Progress Notes (Signed)
Called to room to assist during endoscopic procedure.  Patient ID and intended procedure confirmed with present staff. Received instructions for my participation in the procedure from the performing physician.  

## 2016-09-22 NOTE — Op Note (Signed)
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 The patient's tolerance of                            previous anesthesia was also reviewed. The risks                            and benefits of the procedure and the sedation                            options and risks were discussed with the patient.                            All questions were answered, and informed consent                            was obtained. Prior Anticoagulants: The patient has  taken no previous anticoagulant or antiplatelet                            agents. ASA Grade Assessment: II - A patient with                            mild systemic disease. After reviewing the risks                            and benefits, the patient was deemed in                            satisfactory condition to undergo the procedure.                           After obtaining informed  consent, the colonoscope                            was passed under direct vision. Throughout the                            procedure, the patient's blood pressure, pulse, and                            oxygen saturations were monitored continuously. The                            Colonoscope was introduced through the anus and                            advanced to the the cecum, identified by                            appendiceal orifice and ileocecal valve. The                            colonoscopy was performed without difficulty. The                            patient tolerated the procedure well. The quality                            of the bowel preparation was good. The ileocecal                            valve, appendiceal orifice, and rectum were                            photographed. Scope In: 10:47:30 AM Scope Out: 11:13:14 AM Scope Withdrawal Time: 0 hours 23 minutes 11 seconds  Total Procedure Duration: 0 hours 25 minutes 44 seconds  Findings:                 The perianal and digital rectal examinations were  normal.                           A tattoo was seen in the proximal ascending colon                            immediately adjacent to a diverticulum. This is                            located immediately distal to the ileocecal valve.                            A post-polypectomy scar was found at the tattoo                            site and at the diverticulum. In this area there                            were subtle changes suggestive of residual adenoma                            which was examined under white light and narrowband                            imaging. Multiple biopsies were obtained with cold                            forceps for histology in a targeted manner to                            evaluate for residual adenoma. Area several folds                            distal to the diverticulum with possible  residual                            adenoma and was tattooed with an injection on upset                            walls with 2 mL of Spot (carbon black).                           Multiple small and large-mouthed diverticula were                            found from cecum to sigmoid colon.                           There was evidence of a prior end-to-side                            colo-colonic anastomosis in the sigmoid colon. This  was patent and was characterized by healthy                            appearing mucosa.                           Non-bleeding internal hemorrhoids were found during                            retroflexion. The hemorrhoids were small. Complications:            No immediate complications. Estimated Blood Loss:     Estimated blood loss was minimal. Impression:               - A tattoo was seen in the proximal ascending colon                            adjacent to a diverticulum. A post-polypectomy scar                            was found at the tattoo site along with possible                            residual adenoma growing from the diverticulum.                            Tattooed distally.                           - Moderate diverticulosis from cecum to sigmoid                            colon.                           - Patent end-to-side colo-colonic anastomosis,                            characterized by healthy appearing mucosa.                           - Non-bleeding internal hemorrhoids.                           - Multiple biopsies were obtained. Recommendation:           - Patient has a contact number available for                            emergencies. The signs and symptoms of potential                            delayed complications were discussed with the                            patient. Return to normal activities tomorrow.  Written discharge instructions were provided to the                             patient.                           - Resume previous diet.                           - Continue present medications.                           - Await pathology results before determining how to                            proceed.                           - Repeat colonoscopy date to be determined after                            pending pathology results are reviewed for                            surveillance based on pathology results. Jerene Bears, MD 09/22/2016 11:27:45 AM This report has been signed electronically.

## 2016-09-23 ENCOUNTER — Telehealth: Payer: Self-pay | Admitting: *Deleted

## 2016-09-23 NOTE — Telephone Encounter (Signed)
  Follow up Call-  Call back number 09/22/2016 02/07/2016 05/30/2015  Post procedure Call Back phone  # [phone]  Permission to leave phone message Yes Yes Yes  Some recent data might be hidden     Patient questions:  Do you have a fever, pain , or abdominal swelling? No. Pain Score  0 *  Have you tolerated food without any problems? Yes.    Have you been able to return to your normal activities? Yes.    Do you have any questions about your discharge instructions: Diet   No. Medications  No. Follow up visit  No.  Do you have questions or concerns about your Care? No.  Actions: * If pain score is 4 or above: No action needed, pain <4.

## 2016-10-12 ENCOUNTER — Telehealth: Payer: Self-pay | Admitting: Internal Medicine

## 2016-10-12 NOTE — Telephone Encounter (Signed)
Pt wants to know what Dr. Hilarie Fredrickson would do it he were in Fernando Mcfarland shoes? Would he have surgery? Pt thinks he at least wants to schedule a consult with [facility]. Dr. Hilarie Fredrickson please advise.

## 2016-10-12 NOTE — Telephone Encounter (Signed)
Another long discussion with the patient and his wife He is struggling between surgical resection versus surveillance colonoscopy. This is complicated by his recent retirement, pending change in insurance and pending move to [place] All very reasonable He will think more about this and let me know. If he chooses surgical referral will refer him to Dr. 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

## 2016-10-13 NOTE — Telephone Encounter (Signed)
Pt scheduled to see Dr. Drue Flirt at [facility] on the 4th floor of the cancer center 11/04/16@8 :30am, pt to arrive there at 8:15am. Records faxed to [phone]. Pt aware.

## 2016-11-30 ENCOUNTER — Telehealth: Payer: Self-pay | Admitting: Internal Medicine

## 2016-11-30 NOTE — Telephone Encounter (Signed)
Dr. Pyrtle notified.
